# Patient Record
Sex: Male | Born: 1990
Health system: Southern US, Community
[De-identification: ages and names within clinical notes are randomized; demographics above are authoritative.]

## PROBLEM LIST (undated history)

## (undated) DIAGNOSIS — E291 Testicular hypofunction: Secondary | ICD-10-CM

## (undated) DIAGNOSIS — E669 Obesity, unspecified: Secondary | ICD-10-CM

## (undated) DIAGNOSIS — I10 Essential (primary) hypertension: Secondary | ICD-10-CM

## (undated) HISTORY — DX: Testicular hypofunction: E29.1

## (undated) HISTORY — DX: Essential (primary) hypertension: I10

## (undated) HISTORY — DX: Obesity, unspecified: E66.9

---

## 2016-05-05 ENCOUNTER — Encounter: Payer: Self-pay | Admitting: Physician Assistant

## 2016-05-05 ENCOUNTER — Ambulatory Visit (INDEPENDENT_AMBULATORY_CARE_PROVIDER_SITE_OTHER): Payer: BLUE CROSS/BLUE SHIELD | Admitting: Physician Assistant

## 2016-05-05 VITALS — BP 153/80 | HR 93 | Ht 71.0 in | Wt 348.0 lb

## 2016-05-05 DIAGNOSIS — R5383 Other fatigue: Secondary | ICD-10-CM | POA: Insufficient documentation

## 2016-05-05 DIAGNOSIS — M5126 Other intervertebral disc displacement, lumbar region: Secondary | ICD-10-CM

## 2016-05-05 DIAGNOSIS — Z131 Encounter for screening for diabetes mellitus: Secondary | ICD-10-CM

## 2016-05-05 DIAGNOSIS — Z0000 Encounter for general adult medical examination without abnormal findings: Secondary | ICD-10-CM

## 2016-05-05 DIAGNOSIS — Z13220 Encounter for screening for lipoid disorders: Secondary | ICD-10-CM

## 2016-05-05 MED ORDER — LISINOPRIL 20 MG PO TABS: 20.0000 mg | ORAL_TABLET | Freq: Every day | ORAL | Status: DC

## 2016-05-05 MED ORDER — METHYLPREDNISOLONE 4 MG PO TBPK: ORAL_TABLET | ORAL | Status: DC

## 2016-05-05 NOTE — Progress Notes (Signed)
   Subjective:    Patient ID: Dale Howard, male    DOB: [birth year], 25 y.o.   MRN: [id number]  HPI Pt is a 25 yo male who presents to the clinic to establish care.   .. Active Ambulatory Problems    Diagnosis Date Noted  . Lumbar disc herniation 05/05/2016  . Morbid obesity due to excess calories (HCC) 05/05/2016  . Other fatigue 05/05/2016   Resolved Ambulatory Problems    Diagnosis Date Noted  . No Resolved Ambulatory Problems   No Additional Past Medical History   .Marland Kitchen. Family History  Problem Relation Age of Onset  . Heart attack Maternal Grandmother   . Hypertension Maternal Grandmother   . Hyperlipidemia Maternal Grandmother    . Social History   Social History  . Marital Status: Single    Spouse Name: N/A  . Number of Children: N/A  . Years of Education: N/A   Occupational History  . Not on file.   Social History Main Topics  . Smoking status: Former Games developermoker  . Smokeless tobacco: Not on file  . Alcohol Use: 0.0 oz/week    0 Standard drinks or equivalent per week  . Drug Use: No  . Sexual Activity: Yes   Other Topics Concern  . Not on file   Social History Narrative  . No narrative on file   Pt would like to lose weight. He has stopped drinking sodas. He would like medication help.   He is tired and would like some labs. Not had fasting labs done in over 3 years.   He has had an ongoing cough and congestion for a month. Clear sputum production. No fever, chills, ST, ear pain, SOB, or wheezing. Taking zyrtec.    Review of Systems  All other systems reviewed and are negative.      Objective:   Physical Exam  Constitutional: He is oriented to person, place, and time. He appears well-developed and well-nourished.  Obesity.   HENT:  Head: Normocephalic and atraumatic.  Right Ear: External ear normal.  Left Ear: External ear normal.  Nose: Nose normal.  Mouth/Throat: Oropharynx is clear and moist. No oropharyngeal exudate.  PND on oropharynx.    Eyes: Conjunctivae are normal. Right eye exhibits no discharge. Left eye exhibits no discharge.  Neck: Normal range of motion. Neck supple.  Cardiovascular: Normal rate, regular rhythm and normal heart sounds.   Pulmonary/Chest: Effort normal and breath sounds normal. He has no wheezes.  Lymphadenopathy:    He has no cervical adenopathy.  Neurological: He is alert and oriented to person, place, and time.  Skin: Skin is dry.  Psychiatric: He has a normal mood and affect. His behavior is normal.          Assessment & Plan:  HTN- start lisinopril 20mg  daily. Discussed SE's. Follow up in 1 month for BP recheck.   Morbid obesity- discussed options. Would like to start phentermine but have to control BP first. Discussed calorie restrictions and start exercising regularly.   PND/allergic rhinitis- reassured patient no signs of infection. Continue zyrtec. Medrol dose pak given.   Fatigue- panel ordered. Denies depression PHQ-2 was 0.   Routine physical- lipid and cmp ordered.

## 2016-05-17 LAB — CBC
HCT: 50.7 % — ABNORMAL HIGH (ref 38.5–50.0)
HEMOGLOBIN: 17.4 g/dL — AB (ref 13.2–17.1)
MCH: 29.5 pg (ref 27.0–33.0)
MCHC: 34.3 g/dL (ref 32.0–36.0)
MCV: 86.1 fL (ref 80.0–100.0)
MPV: 11.8 fL (ref 7.5–12.5)
Platelets: 208 10*3/uL (ref 140–400)
RBC: 5.89 MIL/uL — AB (ref 4.20–5.80)
RDW: 14.2 % (ref 11.0–15.0)
WBC: 10 10*3/uL (ref 3.8–10.8)

## 2016-05-18 DIAGNOSIS — S62612A Displaced fracture of proximal phalanx of right middle finger, initial encounter for closed fracture: Secondary | ICD-10-CM | POA: Insufficient documentation

## 2016-05-18 LAB — COMPLETE METABOLIC PANEL WITH GFR
ALT: 57 U/L — AB (ref 9–46)
AST: 29 U/L (ref 10–40)
Albumin: 4.4 g/dL (ref 3.6–5.1)
Alkaline Phosphatase: 106 U/L (ref 40–115)
BUN: 16 mg/dL (ref 7–25)
CALCIUM: 9.2 mg/dL (ref 8.6–10.3)
CHLORIDE: 103 mmol/L (ref 98–110)
CO2: 21 mmol/L (ref 20–31)
CREATININE: 0.84 mg/dL (ref 0.60–1.35)
GFR, Est African American: >89 mL/min (ref >=60)
GFR, Est Non African American: >89 mL/min (ref >=60)
Glucose, Bld: 86 mg/dL (ref 65–99)
Potassium: 4.3 mmol/L (ref 3.5–5.3)
Sodium: 140 mmol/L (ref 135–146)
Total Bilirubin: 0.4 mg/dL (ref 0.2–1.2)
Total Protein: 7.3 g/dL (ref 6.1–8.1)

## 2016-05-18 LAB — LIPID PANEL
CHOLESTEROL: 166 mg/dL (ref 125–200)
HDL: 45 mg/dL (ref >=40)
LDL Cholesterol: 99 mg/dL (ref <130)
Total CHOL/HDL Ratio: 3.7 Ratio (ref <=5.0)
Triglycerides: 111 mg/dL (ref <150)
VLDL: 22 mg/dL (ref <30)

## 2016-05-18 LAB — TESTOSTERONE TOTAL,FREE,BIO, MALES
Albumin: 4.4 g/dL (ref 3.6–5.1)
Sex Hormone Binding: 12 nmol/L (ref 10–50)
TESTOSTERONE BIOAVAILABLE: 101.8 ng/dL — AB (ref 130.5–681.7)
Testosterone, Free: 50.6 pg/mL (ref 47.0–244.0)
Testosterone: 205 ng/dL — ABNORMAL LOW (ref 250–827)

## 2016-05-18 LAB — VITAMIN D 25 HYDROXY (VIT D DEFICIENCY, FRACTURES): Vit D, 25-Hydroxy: 15 ng/mL — ABNORMAL LOW (ref 30–100)

## 2016-05-18 LAB — TSH: TSH: 1.11 m[IU]/L (ref 0.40–4.50)

## 2016-05-18 LAB — C-REACTIVE PROTEIN

## 2016-05-18 LAB — SEDIMENTATION RATE: Sed Rate: 1 mm/hr (ref 0–15)

## 2016-05-18 LAB — VITAMIN B12: VITAMIN B 12: 475 pg/mL (ref 200–1100)

## 2016-05-18 LAB — FERRITIN: Ferritin: 137 ng/mL (ref 20–345)

## 2016-05-19 ENCOUNTER — Encounter: Payer: Self-pay | Admitting: Physician Assistant

## 2016-05-19 DIAGNOSIS — E559 Vitamin D deficiency, unspecified: Secondary | ICD-10-CM | POA: Insufficient documentation

## 2016-05-19 DIAGNOSIS — R740 Nonspecific elevation of levels of transaminase and lactic acid dehydrogenase [LDH]: Secondary | ICD-10-CM

## 2016-05-19 DIAGNOSIS — R7989 Other specified abnormal findings of blood chemistry: Secondary | ICD-10-CM | POA: Insufficient documentation

## 2016-05-19 DIAGNOSIS — R7401 Elevation of levels of liver transaminase levels: Secondary | ICD-10-CM | POA: Insufficient documentation

## 2016-05-23 ENCOUNTER — Other Ambulatory Visit: Payer: Self-pay

## 2016-05-23 MED ORDER — VITAMIN D (ERGOCALCIFEROL) 1.25 MG (50000 UNIT) PO CAPS: 50000.0000 [IU] | ORAL_CAPSULE | ORAL | Status: DC

## 2016-06-02 ENCOUNTER — Encounter: Payer: Self-pay | Admitting: Physician Assistant

## 2016-06-02 ENCOUNTER — Ambulatory Visit (INDEPENDENT_AMBULATORY_CARE_PROVIDER_SITE_OTHER): Payer: BLUE CROSS/BLUE SHIELD | Admitting: Physician Assistant

## 2016-06-02 DIAGNOSIS — Z79899 Other long term (current) drug therapy: Secondary | ICD-10-CM

## 2016-06-02 DIAGNOSIS — R748 Abnormal levels of other serum enzymes: Secondary | ICD-10-CM

## 2016-06-02 DIAGNOSIS — E291 Testicular hypofunction: Secondary | ICD-10-CM

## 2016-06-02 DIAGNOSIS — I10 Essential (primary) hypertension: Secondary | ICD-10-CM

## 2016-06-02 DIAGNOSIS — R7401 Elevation of levels of liver transaminase levels: Secondary | ICD-10-CM

## 2016-06-02 DIAGNOSIS — D582 Other hemoglobinopathies: Secondary | ICD-10-CM

## 2016-06-02 DIAGNOSIS — R740 Nonspecific elevation of levels of transaminase and lactic acid dehydrogenase [LDH]: Secondary | ICD-10-CM

## 2016-06-02 DIAGNOSIS — R7989 Other specified abnormal findings of blood chemistry: Secondary | ICD-10-CM

## 2016-06-02 MED ORDER — PHENTERMINE HCL 37.5 MG PO TABS: 37.5000 mg | ORAL_TABLET | Freq: Every day | ORAL | Status: DC

## 2016-06-02 MED ORDER — LISINOPRIL 20 MG PO TABS: 20.0000 mg | ORAL_TABLET | Freq: Every day | ORAL | Status: DC

## 2016-06-02 NOTE — Patient Instructions (Signed)

## 2016-06-05 DIAGNOSIS — I10 Essential (primary) hypertension: Secondary | ICD-10-CM | POA: Insufficient documentation

## 2016-06-05 DIAGNOSIS — D582 Other hemoglobinopathies: Secondary | ICD-10-CM | POA: Insufficient documentation

## 2016-06-05 NOTE — Progress Notes (Signed)
   Subjective:    Patient ID: Dale Howard, male    DOB: 05-Jul-1991, 25 y.o.   MRN: [id number]  HPI Patient is a 25 year old male who presents to the clinic to follow-up on blood pressure. He establish as a new patient and his blood pressure was elevated. He was hoping to start weight loss medication and I told him we had to get his blood pressure under control first. He is now taking lisinopril 20 mg daily. He denies any chest pains, palpitations, headaches or dizziness. He is taking his lisinopril daily.  Patient is very interested in phentermine. He has heard of his friends that have been on and lost a lot of weight. He is very motivated. He is early started working out in his wife who is with him today is very encouraged that they can work out together.  We did go over labs. His testosterone was low. He would like to consider replacement.   Review of Systems  All other systems reviewed and are negative.      Objective:   Physical Exam  Constitutional: He is oriented to person, place, and time. He appears well-developed and well-nourished.  Morbidly obese.   HENT:  Head: Normocephalic and atraumatic.  Cardiovascular: Normal rate, regular rhythm and normal heart sounds.   Pulmonary/Chest: Effort normal and breath sounds normal.  Neurological: He is alert and oriented to person, place, and time.  Psychiatric: He has a normal mood and affect. His behavior is normal.          Assessment & Plan:  Morbid obesity- started phentermine today. Discussed side effects. Encouraged exercise and diet changes. Follow up in 1 month.   Elevated liver enzymes- will recheck today.   Low testosterone- will check testosterone, cbc, and psa. In 2 weeks.   Elevated hgb, hematocrit- pt describes a time where he had to get "blood drawn because he made too many RBC". Sounds like polycythemia vera. Will try to get records. May have to hold on testosterone replacment and make sure safe since  testosterone can increase hemocrit.   Vitamin D def- stay on high dose. Will recheck.   HTN- controlled today. Likely will decrease with weight loss. Continue on lisinopril for now.

## 2016-06-28 ENCOUNTER — Ambulatory Visit (INDEPENDENT_AMBULATORY_CARE_PROVIDER_SITE_OTHER): Payer: BLUE CROSS/BLUE SHIELD | Admitting: Physician Assistant

## 2016-06-28 ENCOUNTER — Encounter: Payer: Self-pay | Admitting: Physician Assistant

## 2016-06-28 DIAGNOSIS — R740 Nonspecific elevation of levels of transaminase and lactic acid dehydrogenase [LDH]: Secondary | ICD-10-CM | POA: Diagnosis not present

## 2016-06-28 DIAGNOSIS — E291 Testicular hypofunction: Secondary | ICD-10-CM | POA: Diagnosis not present

## 2016-06-28 DIAGNOSIS — R7401 Elevation of levels of liver transaminase levels: Secondary | ICD-10-CM

## 2016-06-28 DIAGNOSIS — R635 Abnormal weight gain: Secondary | ICD-10-CM

## 2016-06-28 DIAGNOSIS — Z6841 Body Mass Index (BMI) 40.0 and over, adult: Secondary | ICD-10-CM | POA: Insufficient documentation

## 2016-06-28 LAB — PSA: PSA: 0.41 ng/mL (ref <=4.00)

## 2016-06-28 LAB — HEPATIC FUNCTION PANEL
ALBUMIN: 4.4 g/dL (ref 3.6–5.1)
ALK PHOS: 101 U/L (ref 40–115)
ALT: 43 U/L (ref 9–46)
AST: 24 U/L (ref 10–40)
BILIRUBIN TOTAL: 0.7 mg/dL (ref 0.2–1.2)
Bilirubin, Direct: 0.1 mg/dL (ref <=0.2)
Indirect Bilirubin: 0.6 mg/dL (ref 0.2–1.2)
TOTAL PROTEIN: 7.2 g/dL (ref 6.1–8.1)

## 2016-06-28 LAB — CBC WITH DIFFERENTIAL/PLATELET
BASOS ABS: 0 {cells}/uL (ref 0–200)
BASOS PCT: 0 %
EOS ABS: 231 {cells}/uL (ref 15–500)
EOS PCT: 3 %
HCT: 48.3 % (ref 38.5–50.0)
HEMOGLOBIN: 16.3 g/dL (ref 13.2–17.1)
LYMPHS ABS: 1925 {cells}/uL (ref 850–3900)
Lymphocytes Relative: 25 %
MCH: 29.2 pg (ref 27.0–33.0)
MCHC: 33.7 g/dL (ref 32.0–36.0)
MCV: 86.6 fL (ref 80.0–100.0)
MONOS PCT: 12 %
MPV: 12 fL (ref 7.5–12.5)
Monocytes Absolute: 924 cells/uL (ref 200–950)
NEUTROS ABS: 4620 {cells}/uL (ref 1500–7800)
Neutrophils Relative %: 60 %
PLATELETS: 204 10*3/uL (ref 140–400)
RBC: 5.58 MIL/uL (ref 4.20–5.80)
RDW: 14.2 % (ref 11.0–15.0)
WBC: 7.7 10*3/uL (ref 3.8–10.8)

## 2016-06-28 LAB — TESTOSTERONE TOTAL,FREE,BIO, MALES
Albumin: 4.4 g/dL (ref 3.6–5.1)
SEX HORMONE BINDING: 13 nmol/L (ref 10–50)
TESTOSTERONE BIOAVAILABLE: 99.9 ng/dL — AB (ref 130.5–681.7)
TESTOSTERONE FREE: 49.6 pg/mL (ref 47.0–244.0)
TESTOSTERONE: 210 ng/dL — AB (ref 250–827)

## 2016-06-28 MED ORDER — PHENTERMINE HCL 37.5 MG PO TABS: 37.5000 mg | ORAL_TABLET | Freq: Every day | ORAL | Status: DC

## 2016-06-28 NOTE — Progress Notes (Signed)
   Subjective:    Patient ID: Dale Howard, male    DOB: 08/17/1991, 25 y.o.   MRN: [id number]  HPI  Patient is a 25 year old male who presents to the clinic to follow-up on phentermine and weight loss. He has lost 14 pounds in one month. He is taking 1 tablet of phentermine daily. He has a lot more energy and feels more focused. He is exercising nearly every day. He notices he does not have cravings and sticking to a healthy diet. He denies any insomnia, anxiety, palpitations, chest pain. He does have some dry mouth.  He did have some recent labs done and would like to discuss these today.    Review of Systems  All other systems reviewed and are negative.      Objective:   Physical Exam  Constitutional: He is oriented to person, place, and time. He appears well-developed and well-nourished.  Morbid obesity.   Cardiovascular: Normal rate, regular rhythm and normal heart sounds.   Pulmonary/Chest: Effort normal and breath sounds normal.  Neurological: He is alert and oriented to person, place, and time.  Psychiatric: He has a normal mood and affect. His behavior is normal.          Assessment & Plan:  Morbid obesity/abnormal weight loss- refilled phentermine. Next visit nurse visit only. Continue with diet and exercise.   Male hypogonadism- testosterone is low and I do think patient would benefit from testosterone replacement. PSA normal however hgb is elevated and a suspicous hx of polycythemia vera. I am concerned this is a contraindication for testosterone replacement. I will consult endocrinology and see what they say or if they would like to follow.   Elevated liver enzymes-resolved.

## 2016-07-14 ENCOUNTER — Telehealth: Payer: Self-pay | Admitting: Physician Assistant

## 2016-07-18 NOTE — Telephone Encounter (Signed)
Joy with Dr Tonna Boehringer office called and states Dr Morrison Old said it would not be a good idea to start testosterone.

## 2016-07-18 NOTE — Telephone Encounter (Signed)
Left a message with Dr Tonna Boehringer office to see if polycythemia vera is a contraindication for testosterone.

## 2016-07-19 NOTE — Telephone Encounter (Signed)
Call pt: endocrinology does not think we should do any testosterone due to hematocrit and dx of polycythemia vera. If you wanted a consult we could arrange to discuss in person.

## 2016-07-24 ENCOUNTER — Telehealth: Payer: Self-pay

## 2016-07-24 ENCOUNTER — Ambulatory Visit (INDEPENDENT_AMBULATORY_CARE_PROVIDER_SITE_OTHER): Payer: BLUE CROSS/BLUE SHIELD | Admitting: Family Medicine

## 2016-07-24 VITALS — BP 126/82 | HR 98 | Wt 321.8 lb

## 2016-07-24 DIAGNOSIS — Z6841 Body Mass Index (BMI) 40.0 and over, adult: Secondary | ICD-10-CM | POA: Diagnosis not present

## 2016-07-24 DIAGNOSIS — R635 Abnormal weight gain: Secondary | ICD-10-CM | POA: Diagnosis not present

## 2016-07-24 MED ORDER — PHENTERMINE HCL 37.5 MG PO TABS: 37.5000 mg | ORAL_TABLET | Freq: Every day | ORAL | 0 refills | Status: DC

## 2016-07-24 NOTE — Progress Notes (Signed)
   Subjective:    Patient ID: Dale Howard, male    DOB: June 14, 1991, 25 y.o.   MRN: [id number]  HPI  Pt here for BP and WT check.  Denies trouble sleeping, palpitations, and medication problems.      Review of Systems     Objective:   Physical Exam        Assessment & Plan:  Morbid obesity/BMI greater than 40/abnormal weight gain-Pt has lost weight.  A refill for Phentermine will be faxed to the pharmacy.  Pt advised to schedule a nurse  appointment in 30 days for BP and wt check.  Down 7 pounds.  BMI now 44.8. Continue current regimen. Follow-up in one month. Nani Gasseratherine Metheney, MD

## 2016-07-25 NOTE — Telephone Encounter (Signed)
No I consulted endocrinologist and did not we me to start testosterone. If you do want to start I would like for you to discuss with endocrinology. Would you want a consult?

## 2016-07-25 NOTE — Telephone Encounter (Signed)
Pt will hold off on meeting with endocrinology for now.

## 2016-08-22 ENCOUNTER — Ambulatory Visit: Payer: BLUE CROSS/BLUE SHIELD

## 2016-08-25 ENCOUNTER — Ambulatory Visit: Payer: BLUE CROSS/BLUE SHIELD

## 2016-10-13 ENCOUNTER — Encounter: Payer: Self-pay | Admitting: Physician Assistant

## 2016-10-13 ENCOUNTER — Ambulatory Visit (INDEPENDENT_AMBULATORY_CARE_PROVIDER_SITE_OTHER): Payer: BLUE CROSS/BLUE SHIELD | Admitting: Physician Assistant

## 2016-10-13 DIAGNOSIS — N522 Drug-induced erectile dysfunction: Secondary | ICD-10-CM | POA: Diagnosis not present

## 2016-10-13 MED ORDER — LIRAGLUTIDE -WEIGHT MANAGEMENT 18 MG/3ML ~~LOC~~ SOPN: 0.6000 mg | PEN_INJECTOR | Freq: Every day | SUBCUTANEOUS | 1 refills | Status: DC

## 2016-10-13 MED ORDER — SILDENAFIL CITRATE 100 MG PO TABS: 100.0000 mg | ORAL_TABLET | ORAL | 2 refills | Status: DC | PRN

## 2016-10-13 MED ORDER — AMBULATORY NON FORMULARY MEDICATION: 0 refills | Status: DC

## 2016-10-13 NOTE — Patient Instructions (Signed)
Liraglutide injection (Weight Management) What is this medicine? LIRAGLUTIDE (LIR a GLOO tide) is used with a reduced calorie diet and exercise to help you lose weight. This medicine may be used for other purposes; ask your health care provider or pharmacist if you have questions. What should I tell my health care provider before I take this medicine? They need to know if you have any of these conditions: -endocrine tumors (MEN 2) or if someone in your family had these tumors -gallstones -high cholesterol -history of alcohol abuse problem -history of pancreatitis -kidney disease or if you are on dialysis -liver disease -previous swelling of the tongue, face, or lips with difficulty breathing, difficulty swallowing, hoarseness, or tightening of the throat -stomach problems -suicidal thoughts, plans, or attempt; a previous suicide attempt by you or a family member -thyroid cancer or if someone in your family had thyroid cancer -an unusual or allergic reaction to liraglutide, medicines, foods, dyes, or preservatives -pregnant or trying to get pregnant -breast-feeding How should I use this medicine? This medicine is for injection under the skin of your upper leg, stomach area, or upper arm. You will be taught how to prepare and give this medicine. Use exactly as directed. Take your medicine at regular intervals. Do not take it more often than directed. It is important that you put your used needles and syringes in a special sharps container. Do not put them in a trash can. If you do not have a sharps container, call your pharmacist or healthcare provider to get one. A special MedGuide will be given to you by the pharmacist with each prescription and refill. Be sure to read this information carefully each time. Talk to your pediatrician regarding the use of this medicine in children. Special care may be needed. Overdosage: If you think you have taken too much of this medicine contact a poison  control center or emergency room at once. NOTE: This medicine is only for you. Do not share this medicine with others. What if I miss a dose? If you miss a dose, take it as soon as you can. If it is almost time for your next dose, take only that dose. Do not take double or extra doses. If you miss your dose for 3 days or more, call your doctor or health care professional to talk about how to restart this medicine. What may interact with this medicine? -acetaminophen -atorvastatin -birth control pills -digoxin -griseofulvin -lisinopril This list may not describe all possible interactions. Give your health care provider a list of all the medicines, herbs, non-prescription drugs, or dietary supplements you use. Also tell them if you smoke, drink alcohol, or use illegal drugs. Some items may interact with your medicine. What should I watch for while using this medicine? Visit your doctor or health care professional for regular checks on your progress. This medicine is intended to be used in addition to a healthy diet and appropriate exercise. The best results are achieved this way. Do not increase or in any way change your dose without consulting your doctor or health care professional. This medicine may affect blood sugar levels. If you have diabetes, check with your doctor or health care professional before you change your diet or the dose of your diabetic medicine. Patients and their families should watch out for worsening depression or thoughts of suicide. Also watch out for sudden changes in feelings such as feeling anxious, agitated, panicky, irritable, hostile, aggressive, impulsive, severely restless, overly excited and hyperactive, or not being   able to sleep. If this happens, especially at the beginning of treatment or after a change in dose, call your health care professional. What side effects may I notice from receiving this medicine? Side effects that you should report to your doctor or  health care professional as soon as possible: -allergic reactions like skin rash, itching or hives, swelling of the face, lips, or tongue -breathing problems -fever, chills -loss of appetite -signs and symptoms of low blood sugar such as feeling anxious, confusion, dizziness, increased hunger, unusually weak or tired, sweating, shakiness, cold, irritable, headache, blurred vision, fast heartbeat, loss of consciousness -trouble passing urine or change in the amount of urine -unusual stomach pain or upset -vomiting Side effects that usually do not require medical attention (Report these to your doctor or health care professional if they continue or are bothersome.): -constipation -diarrhea -fatigue -headache -nausea This list may not describe all possible side effects. Call your doctor for medical advice about side effects. You may report side effects to FDA at 1-800-FDA-1088. Where should I keep my medicine? Keep out of the reach of children. Store unopened pen in a refrigerator between 2 and 8 degrees C (36 and 46 degrees F). Do not freeze or use if the medicine has been frozen. Protect from light and excessive heat. After you first use the pen, it can be stored at room temperature between 15 and 30 degrees C (59 and 86 degrees F) or in a refrigerator. Throw away your used pen after 30 days or after the expiration date, whichever comes first. Do not store your pen with the needle attached. If the needle is left on, medicine may leak from the pen. NOTE: This sheet is a summary. It may not cover all possible information. If you have questions about this medicine, talk to your doctor, pharmacist, or health care provider.    2016, Elsevier/Gold Standard. (2014-01-29 12:29:49)  

## 2016-10-13 NOTE — Progress Notes (Addendum)
   Subjective:    Patient ID: Dale Howard, male    DOB: [birth year], 25 y.o.   MRN: [id number]  HPI  Pt is a 25 yo male who presents to the clinic to discuss weight loss.  His original weight was 348. He is now 321. Phentermine is really not helping him at this point and causing ED. He would like something else to help with ED. He is working out and lifting weights 3 times a week. He wants to continue to lose weight.    Review of Systems  All other systems reviewed and are negative.      Objective:   Physical Exam  Constitutional: He is oriented to person, place, and time. He appears well-developed and well-nourished.  Morbidly obese.   HENT:  Head: Normocephalic and atraumatic.  Cardiovascular: Normal rate, regular rhythm and normal heart sounds.   Pulmonary/Chest: Effort normal and breath sounds normal.  Neurological: He is alert and oriented to person, place, and time.  Psychiatric: He has a normal mood and affect. His behavior is normal.          Assessment & Plan:  Marland Kitchen.Marland Kitchen.Diagnoses and all orders for this visit:  Morbid obesity (HCC) -     Liraglutide -Weight Management (SAXENDA) 18 MG/3ML SOPN; Inject 0.6 mg into the skin daily. For one week then increase by .6mg  weekly until reaches 3mg  daily.  Please include ultra fine needles 6mm -     AMBULATORY NON FORMULARY MEDICATION; 6mm ultra find needles for saxenda pen to use once daily.  Drug-induced erectile dysfunction -     sildenafil (VIAGRA) 100 MG tablet; Take 1 tablet (100 mg total) by mouth as needed for erectile dysfunction (for use prior to sexual activity).   Discussed with patient how to use and side effects of saxenda.  Follow up in 2 months.  Discussed 1500-2000 calorie diet and regular 150 minutes a week exercise.  Phentermine likely caused ED.  viagra given to try.

## 2016-11-03 ENCOUNTER — Telehealth: Payer: Self-pay | Admitting: *Deleted

## 2016-11-03 NOTE — Telephone Encounter (Signed)
Saxenda submitted through covermymeds : [id number] - Rx #: [id number]

## 2016-11-08 NOTE — Telephone Encounter (Signed)
Tried to call patient and connection was bad; hung up and will try later

## 2016-11-08 NOTE — Telephone Encounter (Signed)
saxenda has been denied by insurance. Patient needs to have tried both Belviq and Qsymia (noted Qsymia contraindicated because  phentermine component may  caused ED since patient did take phentermine and this was side effect)

## 2016-11-08 NOTE — Telephone Encounter (Signed)
Ok call pt and see if would like to try belviq 10mg  bid for next 2 months. Biggest side effect is nausea.

## 2016-11-15 MED ORDER — LORCASERIN HCL 10 MG PO TABS: 10.0000 mg | ORAL_TABLET | Freq: Two times a day (BID) | ORAL | 1 refills | Status: DC

## 2016-11-15 NOTE — Telephone Encounter (Signed)
belviq savings card up at the front and patient is aware

## 2016-11-15 NOTE — Telephone Encounter (Signed)
Patient is ok with trying Belviq

## 2016-11-16 ENCOUNTER — Telehealth: Payer: Self-pay | Admitting: *Deleted

## 2016-11-16 NOTE — Telephone Encounter (Signed)
PA submitted for Belviq through covermymeds [id number]

## 2016-11-17 NOTE — Telephone Encounter (Signed)
Patient notified

## 2016-11-21 ENCOUNTER — Ambulatory Visit (INDEPENDENT_AMBULATORY_CARE_PROVIDER_SITE_OTHER): Payer: BLUE CROSS/BLUE SHIELD

## 2016-11-21 ENCOUNTER — Ambulatory Visit (INDEPENDENT_AMBULATORY_CARE_PROVIDER_SITE_OTHER): Payer: BLUE CROSS/BLUE SHIELD | Admitting: Physician Assistant

## 2016-11-21 ENCOUNTER — Encounter: Payer: Self-pay | Admitting: Physician Assistant

## 2016-11-21 VITALS — BP 160/98 | HR 98 | Ht 71.0 in | Wt 312.0 lb

## 2016-11-21 DIAGNOSIS — M545 Low back pain, unspecified: Secondary | ICD-10-CM

## 2016-11-21 DIAGNOSIS — M79605 Pain in left leg: Secondary | ICD-10-CM

## 2016-11-21 DIAGNOSIS — Z8739 Personal history of other diseases of the musculoskeletal system and connective tissue: Secondary | ICD-10-CM

## 2016-11-21 MED ORDER — PREDNISONE 10 MG (21) PO TBPK: ORAL_TABLET | ORAL | 0 refills | Status: DC

## 2016-11-21 MED ORDER — CYCLOBENZAPRINE HCL 10 MG PO TABS: 10.0000 mg | ORAL_TABLET | Freq: Three times a day (TID) | ORAL | 0 refills | Status: DC | PRN

## 2016-11-21 MED ORDER — MELOXICAM 15 MG PO TABS: 15.0000 mg | ORAL_TABLET | Freq: Every day | ORAL | 1 refills | Status: DC

## 2016-11-21 MED ORDER — TRAMADOL HCL 50 MG PO TABS: 50.0000 mg | ORAL_TABLET | Freq: Four times a day (QID) | ORAL | 0 refills | Status: DC | PRN

## 2016-11-21 NOTE — Progress Notes (Addendum)
   Subjective:    Patient ID: Dale Howard, male    DOB: 10-13-1991, 25 y.o.   MRN: [id number]  HPI Patient is a 25 yo complaining of lower back pain that radiates to his left leg for six days. Patient was diagnosed with a herniated lumbar intervertebral disc in 2013. He saw a chiropractor after being diagnosed and his symptoms resolved within a few months. Patient rates the pain a 8/10. He reports that the pain is better when he stands and worse when he lays down. He describes the pain as an electric shock that runs from his lower back down to his left shin. He also reports tingling in his left leg. He also reports that his lower back muscles feel tight. He has not slept the past few nights despite taking ibuprofen 800 MG and tylenol PM 1000 MG. Patient denies neck pain or knee pain. Patient does not recall doing anything to injure himself.    Review of Systems See HPI    Objective:   Physical Exam  Constitutional: He is oriented to person, place, and time. He appears well-developed and well-nourished.  Patient appears uncomfortable during exam and has to stand during most of the exam to get relief.  Cardiovascular: Normal rate and regular rhythm.   Pulmonary/Chest: Effort normal.  Musculoskeletal: He exhibits no deformity.  No tenderness to palpation of cervical, thoracic or lumbar vertebrae. No bony abnormalities noted. Decreased ROM with back flexion and extension. Positive straight leg raise on left side.   Neurological: He is alert and oriented to person, place, and time.       Assessment & Plan:  Dale Howard was seen today for back pain.  Diagnoses and all orders for this visit:  1. Low back pain radiating to left leg most likely due to herniated lumbar vertebrae due to patient's symptoms and history of herniated intervertebral disc.  -Start predniSONE; 12 day taper pack to help shrink disc.  -Start cyclobenzaprine (FLEXERIL) 10 MG tablet; Take 1 tablet (10 mg total) by mouth 3 (three)  times daily as needed for muscle spasms. Patient advised to take this medication at night to help him sleep. Patient advised not to drive or operate heavy machinery while taking this medication. - Start meloxicam (MOBIC) 15 MG tablet; Take 1 tablet (15 mg total) by mouth daily to help with pain. Decided to go with Mobic due to acute inflammation and ibuprofen failed to help with pain.  - DG Lumbar Spine Complete to look for displaced vertebrae. Will possibly get an MRI at a later date if patient's pain does not improve.  -Start traMADol (ULTRAM) 50 MG tablet; Take 1 tablet (50 mg total) by mouth every 6 (six) hours as needed for acute pain.  -Referral to physical therapy if back pain does not improve in a week. Patient was given lower back stretches to do on his own for a week.  -Follow-up with sports medicine in 2-3 weeks if back pain does not improve.

## 2016-11-21 NOTE — Addendum Note (Signed)
Addended by: Jomarie LongsBREEBACK, JADE L on: 11/21/2016 10:35 PM   Modules accepted: Orders

## 2016-11-21 NOTE — Patient Instructions (Signed)
Will schedule PT

## 2016-11-21 NOTE — Telephone Encounter (Signed)
Patient was notified that belviq was approved by insurance.approved from 11/16/2016-05/14/2017

## 2016-11-21 NOTE — Progress Notes (Signed)
Call pt: alignment is normal. Disc space look good. No acute changes.

## 2016-12-15 ENCOUNTER — Ambulatory Visit: Payer: BLUE CROSS/BLUE SHIELD | Admitting: Physician Assistant

## 2016-12-20 ENCOUNTER — Ambulatory Visit (INDEPENDENT_AMBULATORY_CARE_PROVIDER_SITE_OTHER): Payer: BLUE CROSS/BLUE SHIELD | Admitting: Physician Assistant

## 2016-12-20 ENCOUNTER — Encounter: Payer: Self-pay | Admitting: Physician Assistant

## 2016-12-20 VITALS — BP 146/83 | HR 111 | Ht 71.0 in | Wt 320.0 lb

## 2016-12-20 DIAGNOSIS — I10 Essential (primary) hypertension: Secondary | ICD-10-CM | POA: Diagnosis not present

## 2016-12-20 MED ORDER — LISINOPRIL 40 MG PO TABS: 40.0000 mg | ORAL_TABLET | Freq: Every day | ORAL | 1 refills | Status: DC

## 2016-12-20 MED ORDER — BUPROPION HCL ER (XL) 150 MG PO TB24: 150.0000 mg | ORAL_TABLET | Freq: Every day | ORAL | 2 refills | Status: DC

## 2016-12-20 NOTE — Progress Notes (Signed)
   Subjective:    Patient ID: Dale Howard, male    DOB: 1991/07/13, 26 y.o.   MRN: [id number]  HPI  Pt presents to the clinic to follow up on weight. Phentermine SE of ED, saxenda insurance would not approve. Belviq was approved but could not get at pharmacy until his ID is updated via the government. He has gained 8lb in last 2 months. He is very interested in the gastric sleeve. He admits to not exercising.   HTN- no CP, palpitations, headaches, or dizziness. He is taking lisinopril daily without any side effects.      Review of Systems  All other systems reviewed and are negative.      Objective:   Physical Exam  Constitutional: He is oriented to person, place, and time. He appears well-developed and well-nourished.  Morbid obesity.   HENT:  Head: Normocephalic and atraumatic.  Cardiovascular: Normal rate, regular rhythm and normal heart sounds.   Pulmonary/Chest: Effort normal and breath sounds normal.  Neurological: He is alert and oriented to person, place, and time.  Psychiatric: He has a normal mood and affect. His behavior is normal.          Assessment & Plan:  Marland Kitchen.Marland Kitchen.Diagnoses and all orders for this visit:  Essential hypertension, benign -     lisinopril (PRINIVIL,ZESTRIL) 40 MG tablet; Take 1 tablet (40 mg total) by mouth daily.  Morbid obesity due to excess calories (HCC) -     buPROPion (WELLBUTRIN XL) 150 MG 24 hr tablet; Take 1 tablet (150 mg total) by mouth daily. -     Ambulatory referral to General Surgery   Will increase lisinopril due to BP not being controlled. Recheck in 2 months.   Since he cannot get belviq which is considered a controlled substance will try wellbutrin. Discussed side effects. Follow up in 2 months. Get belviq when can. Will refer to gastric sleeve.

## 2017-02-06 ENCOUNTER — Encounter: Payer: Self-pay | Admitting: Physician Assistant

## 2017-02-06 ENCOUNTER — Ambulatory Visit (INDEPENDENT_AMBULATORY_CARE_PROVIDER_SITE_OTHER): Payer: BLUE CROSS/BLUE SHIELD | Admitting: Physician Assistant

## 2017-02-06 VITALS — BP 175/120 | HR 109 | Temp 98.0°F | Ht 71.0 in | Wt 315.0 lb

## 2017-02-06 DIAGNOSIS — M545 Low back pain, unspecified: Secondary | ICD-10-CM

## 2017-02-06 DIAGNOSIS — M5126 Other intervertebral disc displacement, lumbar region: Secondary | ICD-10-CM

## 2017-02-06 DIAGNOSIS — I10 Essential (primary) hypertension: Secondary | ICD-10-CM

## 2017-02-06 DIAGNOSIS — M79605 Pain in left leg: Secondary | ICD-10-CM | POA: Insufficient documentation

## 2017-02-06 MED ORDER — CYCLOBENZAPRINE HCL 10 MG PO TABS: 10.0000 mg | ORAL_TABLET | Freq: Three times a day (TID) | ORAL | 0 refills | Status: DC | PRN

## 2017-02-06 MED ORDER — TRAMADOL HCL 50 MG PO TABS: 50.0000 mg | ORAL_TABLET | Freq: Four times a day (QID) | ORAL | 0 refills | Status: DC | PRN

## 2017-02-06 MED ORDER — PREDNISONE 10 MG (21) PO TBPK: ORAL_TABLET | ORAL | 0 refills | Status: DC

## 2017-02-06 NOTE — Patient Instructions (Signed)
Low Back Sprain Rehab  Ask your health care provider which exercises are safe for you. Do exercises exactly as told by your health care provider and adjust them as directed. It is normal to feel mild stretching, pulling, tightness, or discomfort as you do these exercises, but you should stop right away if you feel sudden pain or your pain gets worse. Do not begin these exercises until told by your health care provider.  Stretching and range of motion exercises  These exercises warm up your muscles and joints and improve the movement and flexibility of your back. These exercises also help to relieve pain, numbness, and tingling.  Exercise A: Lumbar rotation     1. Lie on your back on a firm surface and bend your knees.  2. Straighten your arms out to your sides so each arm forms an "L" shape with a side of your body (a 90 degree angle).  3. Slowly move both of your knees to one side of your body until you feel a stretch in your lower back. Try not to let your shoulders move off of the floor.  4. Hold for __________ seconds.  5. Tense your abdominal muscles and slowly move your knees back to the starting position.  6. Repeat this exercise on the other side of your body.  Repeat __________ times. Complete this exercise __________ times a day.  Exercise B: Prone extension on elbows     1. Lie on your abdomen on a firm surface.  2. Prop yourself up on your elbows.  3. Use your arms to help lift your chest up until you feel a gentle stretch in your abdomen and your lower back.  ? This will place some of your body weight on your elbows. If this is uncomfortable, try stacking pillows under your chest.  ? Your hips should stay down, against the surface that you are lying on. Keep your hip and back muscles relaxed.  4. Hold for __________ seconds.  5. Slowly relax your upper body and return to the starting position.  Repeat __________ times. Complete this exercise __________ times a day.  Strengthening exercises  These  exercises build strength and endurance in your back. Endurance is the ability to use your muscles for a long time, even after they get tired.  Exercise C: Pelvic tilt   1. Lie on your back on a firm surface. Bend your knees and keep your feet flat.  2. Tense your abdominal muscles. Tip your pelvis up toward the ceiling and flatten your lower back into the floor.  ? To help with this exercise, you may place a small towel under your lower back and try to push your back into the towel.  3. Hold for __________ seconds.  4. Let your muscles relax completely before you repeat this exercise.  Repeat __________ times. Complete this exercise __________ times a day.  Exercise D: Alternating arm and leg raises     1. Get on your hands and knees on a firm surface. If you are on a hard floor, you may want to use padding to cushion your knees, such as an exercise mat.  2. Line up your arms and legs. Your hands should be below your shoulders, and your knees should be below your hips.  3. Lift your left leg behind you. At the same time, raise your right arm and straighten it in front of you.  ? Do not lift your leg higher than your hip.  ? Do   not lift your arm higher than your shoulder.  ? Keep your abdominal and back muscles tight.  ? Keep your hips facing the ground.  ? Do not arch your back.  ? Keep your balance carefully, and do not hold your breath.  4. Hold for __________ seconds.  5. Slowly return to the starting position and repeat with your right leg and your left arm.  Repeat __________ times. Complete this exercise __________ times a day.  Exercise E: Abdominal set with straight leg raise     1. Lie on your back on a firm surface.  2. Bend one of your knees and keep your other leg straight.  3. Tense your abdominal muscles and lift your straight leg up, 4-6 inches (10-15 cm) off the ground.  4. Keep your abdominal muscles tight and hold for __________ seconds.  ? Do not hold your breath.  ? Do not arch your back. Keep it  flat against the ground.  5. Keep your abdominal muscles tense as you slowly lower your leg back to the starting position.  6. Repeat with your other leg.  Repeat __________ times. Complete this exercise __________ times a day.  Posture and body mechanics     Body mechanics refers to the movements and positions of your body while you do your daily activities. Posture is part of body mechanics. Good posture and healthy body mechanics can help to relieve stress in your body's tissues and joints. Good posture means that your spine is in its natural S-curve position (your spine is neutral), your shoulders are pulled back slightly, and your head is not tipped forward. The following are general guidelines for applying improved posture and body mechanics to your everyday activities.  Standing     · When standing, keep your spine neutral and your feet about hip-width apart. Keep a slight bend in your knees. Your ears, shoulders, and hips should line up.  · When you do a task in which you stand in one place for a long time, place one foot up on a stable object that is 2-4 inches (5-10 cm) high, such as a footstool. This helps keep your spine neutral.  Sitting     · When sitting, keep your spine neutral and keep your feet flat on the floor. Use a footrest, if necessary, and keep your thighs parallel to the floor. Avoid rounding your shoulders, and avoid tilting your head forward.  · When working at a desk or a computer, keep your desk at a height where your hands are slightly lower than your elbows. Slide your chair under your desk so you are close enough to maintain good posture.  · When working at a computer, place your monitor at a height where you are looking straight ahead and you do not have to tilt your head forward or downward to look at the screen.  Resting     · When lying down and resting, avoid positions that are most painful for you.  · If you have pain with activities such as sitting, bending, stooping, or  squatting (flexion-based activities), lie in a position in which your body does not bend very much. For example, avoid curling up on your side with your arms and knees near your chest (fetal position).  · If you have pain with activities such as standing for a long time or reaching with your arms (extension-based activities), lie with your spine in a neutral position and bend your knees slightly. Try the following   positions:  · Lying on your side with a pillow between your knees.  · Lying on your back with a pillow under your knees.  Lifting     · When lifting objects, keep your feet at least shoulder-width apart and tighten your abdominal muscles.  · Bend your knees and hips and keep your spine neutral. It is important to lift using the strength of your legs, not your back. Do not lock your knees straight out.  · Always ask for help to lift heavy or awkward objects.  This information is not intended to replace advice given to you by your health care provider. Make sure you discuss any questions you have with your health care provider.  Document Released: 12/04/2005 Document Revised: 08/10/2016 Document Reviewed: 09/15/2015  Elsevier Interactive Patient Education © 2017 [employer]

## 2017-02-06 NOTE — Progress Notes (Addendum)
Subjective:     Patient ID: Dale Howard, male   DOB: [birth year], 26 y.o.   MRN: [id number]  HPI  This is a 26 year old male who complains of back pain "for a while" that acutely worsened two days ago. He states in 2013 he was diagnosed with a herniated disc which was "never really treated" and states this pain is similar. He describes the pain as sharp and states he notices a shock sensation that goes down front of the left leg and down into the foot. Also relays some numbness over the pretibial area. Has tried Mobic without relief. Denies fevers, tobacoo use, coritcosteroid use, IV drug use, changes in bladder/bowel habits, history of cancer, and trauma.   Review of Systems All other ROS negative except those noted in the HPI.    Objective:   Physical Exam  Constitutional: He is oriented to person, place, and time. He appears well-developed and well-nourished. He appears distressed.  HENT:  Head: Normocephalic and atraumatic.  Neck: Normal range of motion. Neck supple.  Cardiovascular: Normal rate, regular rhythm and normal heart sounds.  Exam reveals no gallop and no friction rub.   No murmur heard. Pulmonary/Chest: Effort normal and breath sounds normal.  Abdominal: Soft. Bowel sounds are normal.  Musculoskeletal:       Right hip: He exhibits normal strength.       Left hip: He exhibits decreased strength. He exhibits normal range of motion.       Lumbar back: He exhibits tenderness.  Positive straight leg raise of left leg, negative of right.  Neurological: He is alert and oriented to person, place, and time. He displays abnormal reflex.  Skin: Skin is warm and dry. No rash noted. No erythema.  Psychiatric: He has a normal mood and affect. His behavior is normal.       Assessment:  Diagnoses and all orders for this visit:  Low back pain radiating to left leg -     cyclobenzaprine (FLEXERIL) 10 MG tablet; Take 1 tablet (10 mg total) by mouth 3 (three) times daily as needed for  muscle spasms. -     traMADol (ULTRAM) 50 MG tablet; Take 1 tablet (50 mg total) by mouth every 6 (six) hours as needed. -     predniSONE (STERAPRED UNI-PAK 21 TAB) 10 MG (21) TBPK tablet; 12 day taper pack, use as directed -     Ambulatory referral to Sports Medicine -     MR Lumbar Spine Wo Contrast; Future -     MR Lumbar Spine Wo Contrast  Essential hypertension, benign  Lumbar disc herniation  Morbid obesity (HCC)  Patient successfully treated for similar pain in December 2017 with Flexril, Tramadol, Prednisone pack, and Mobic so restarted this regimen. MRI ordered given that it has been 5 years since previous MRI and to evaluate for herniated disc or other cause of neurologic symptoms. Referral made to Sports Medicine for possible steroid injection for long term management of low back pain. I do think he would benefit from PT as well. Cost is an issue. Patient instructed to call or come back in if he he begins to experience numbness of his buttocks and inner thighs or loses control of his bowel and/or bladder. Patient written out of work for yesterday, today, and tomorrow.  Pt was strongly encouraged to lose weight. Discussed healthy diet and exercise.   Pt strongly encouraged to start taking BP medications daily. I am sure BP elevation is due to pain  as well but he needs to consider taking medication daily. Recheck in 2 weeks.

## 2017-02-12 ENCOUNTER — Ambulatory Visit (INDEPENDENT_AMBULATORY_CARE_PROVIDER_SITE_OTHER): Payer: BLUE CROSS/BLUE SHIELD

## 2017-02-12 DIAGNOSIS — M5116 Intervertebral disc disorders with radiculopathy, lumbar region: Secondary | ICD-10-CM

## 2017-02-12 DIAGNOSIS — M5117 Intervertebral disc disorders with radiculopathy, lumbosacral region: Secondary | ICD-10-CM

## 2017-02-13 NOTE — Progress Notes (Signed)
Call pt: you do have some disc herniations. I would like to refer you to ortho. Are you ok with this?  Is your back pain better today?

## 2017-02-19 ENCOUNTER — Ambulatory Visit: Payer: BLUE CROSS/BLUE SHIELD | Admitting: Physician Assistant

## 2017-02-20 ENCOUNTER — Other Ambulatory Visit: Payer: Self-pay | Admitting: *Deleted

## 2017-02-20 ENCOUNTER — Ambulatory Visit (INDEPENDENT_AMBULATORY_CARE_PROVIDER_SITE_OTHER): Payer: BLUE CROSS/BLUE SHIELD | Admitting: Sports Medicine

## 2017-02-20 ENCOUNTER — Encounter: Payer: Self-pay | Admitting: Sports Medicine

## 2017-02-20 DIAGNOSIS — M5126 Other intervertebral disc displacement, lumbar region: Secondary | ICD-10-CM

## 2017-02-20 DIAGNOSIS — I10 Essential (primary) hypertension: Secondary | ICD-10-CM

## 2017-02-20 MED ORDER — PREDNISONE 10 MG (48) PO TBPK: ORAL_TABLET | Freq: Every day | ORAL | 0 refills | Status: DC

## 2017-02-20 MED ORDER — LORCASERIN HCL 10 MG PO TABS
10.0000 mg | ORAL_TABLET | Freq: Two times a day (BID) | ORAL | 1 refills | Status: DC
Start: 2017-02-20 — End: 2018-01-03

## 2017-02-20 MED ORDER — LISINOPRIL 40 MG PO TABS: 40.0000 mg | ORAL_TABLET | Freq: Every day | ORAL | 1 refills | Status: DC

## 2017-02-20 NOTE — Assessment & Plan Note (Signed)
With progressive weakness and left-sided foot drop, there is a large disc sequestration at the L4-L5 level with clear compression of the left L4 and L5 nerve roots. Considering his foot drop I do think he needs urgent neurosurgical evaluation. Adding a prednisone taper, his burst has improved his symptoms to some degree. Further management per neurosurgery.

## 2017-02-20 NOTE — Progress Notes (Signed)
   Subjective:    I'm seeing this patient as a consultation for:  Dale GawJade Breeback, PA-C  CC: Herniated disc  HPI: In 2012 this pleasant 26 year old male was pushing a heavy object, it rolled back on him, he had immediate pain in his back, he woke up the next morning with radicular pain down the left leg to the top of the left foot.  He never had bowel or bladder dysfunction or saddle numbness, he has helped had some progressive weakness of his left leg. Overall he did well, but more recently within the past couple of weeks he's noted increasing weakness and pain. Still has no bowel or bladder dysfunction or saddle numbness.  Past medical history:  Negative.  See flowsheet/record as well for more information.  Surgical history: Negative.  See flowsheet/record as well for more information.  Family history: Negative.  See flowsheet/record as well for more information.  Social history: Negative.  See flowsheet/record as well for more information.  Allergies, and medications have been entered into the medical record, reviewed, and no changes needed.   Review of Systems: No headache, visual changes, nausea, vomiting, diarrhea, constipation, dizziness, abdominal pain, skin rash, fevers, chills, night sweats, weight loss, swollen lymph nodes, body aches, joint swelling, muscle aches, chest pain, shortness of breath, mood changes, visual or auditory hallucinations.   Objective:   General: Well Developed, well nourished, and in no acute distress.  Neuro/Psych: Alert and oriented x3, extra-ocular muscles intact, able to move all 4 extremities, sensation grossly intact. Skin: Warm and dry, no rashes noted.  Respiratory: Not using accessory muscles, speaking in full sentences, trachea midline.  Cardiovascular: Pulses palpable, no extremity edema. Abdomen: Does not appear distended. Back Exam:  Inspection: Unremarkable  Motion: Flexion 45 deg, Extension 45 deg, Side Bending to 45 deg bilaterally,    Rotation to 45 deg bilaterally  SLR laying: Positive with reproduction of left-sided radicular symptoms  XSLR laying: Negative  Palpable tenderness: None. FABER: negative. Sensory change: Gross sensation intact to all lumbar and sacral dermatomes.  Reflexes: 2+ at both patellar tendons, 2+ at achilles tendons, Babinski's downgoing.  Strength at foot  Plantar-flexion: 5/5 Dorsi-flexion: 4-/5 Eversion: 5/5 Inversion: 5/5  Leg strength  Quad: 5/5 Hamstring: 5/5 Hip flexor: 5/5 Hip abductors: 5/5  Gait unremarkable.  Impression and Recommendations:   This case required medical decision making of moderate complexity.  Lumbar disc herniation With progressive weakness and left-sided foot drop, there is a large disc sequestration at the L4-L5 level with clear compression of the left L4 and L5 nerve roots. Considering his foot drop I do think he needs urgent neurosurgical evaluation. Adding a prednisone taper, his burst has improved his symptoms to some degree. Further management per neurosurgery.

## 2017-03-15 ENCOUNTER — Telehealth: Payer: Self-pay

## 2017-03-15 NOTE — Telephone Encounter (Signed)
He probably means an epidural, I can order that if he would like, [facility]  I am awaiting Dr. Barnett ApplebaumJones's office note but considering his weakness I do think Dr. Yetta BarreJones needs to reconsider surgical intervention.

## 2017-03-15 NOTE — Telephone Encounter (Signed)
Referral was made on 12/25/16 for [facility] [place] [facility]? Does he want to go somewhere else. If so can place referral or follow up on previous referral placed.

## 2017-03-15 NOTE — Telephone Encounter (Signed)
Pt called and said he saw Dr Yetta BarreJones, neurosurgeon.  He suggested a cortisone shot, and the patient was asking if that can be done here in this office.  Please advise.   Jade- Pt also said he needs a referral for weight loss surgery.  Please advise.

## 2017-03-15 NOTE — Telephone Encounter (Signed)
It was sent, but patient said they did not receive it.  Spoke with cindy and she resent.

## 2017-03-15 NOTE — Telephone Encounter (Signed)
Left message for pt, and told to call office if further questions arise.

## 2018-01-03 ENCOUNTER — Encounter: Payer: Self-pay | Admitting: Physician Assistant

## 2018-01-03 ENCOUNTER — Ambulatory Visit (INDEPENDENT_AMBULATORY_CARE_PROVIDER_SITE_OTHER): Payer: BLUE CROSS/BLUE SHIELD

## 2018-01-03 ENCOUNTER — Ambulatory Visit (INDEPENDENT_AMBULATORY_CARE_PROVIDER_SITE_OTHER): Payer: Self-pay | Admitting: Physician Assistant

## 2018-01-03 DIAGNOSIS — M79605 Pain in left leg: Secondary | ICD-10-CM

## 2018-01-03 DIAGNOSIS — I10 Essential (primary) hypertension: Secondary | ICD-10-CM

## 2018-01-03 DIAGNOSIS — M545 Low back pain, unspecified: Secondary | ICD-10-CM

## 2018-01-03 DIAGNOSIS — R05 Cough: Secondary | ICD-10-CM

## 2018-01-03 DIAGNOSIS — R053 Chronic cough: Secondary | ICD-10-CM | POA: Insufficient documentation

## 2018-01-03 DIAGNOSIS — S139XXA Sprain of joints and ligaments of unspecified parts of neck, initial encounter: Secondary | ICD-10-CM

## 2018-01-03 MED ORDER — MELOXICAM 15 MG PO TABS: 15.0000 mg | ORAL_TABLET | Freq: Every day | ORAL | 0 refills | Status: DC

## 2018-01-03 MED ORDER — CYCLOBENZAPRINE HCL 10 MG PO TABS: 10.0000 mg | ORAL_TABLET | Freq: Three times a day (TID) | ORAL | 0 refills | Status: DC | PRN

## 2018-01-03 MED ORDER — LISINOPRIL 40 MG PO TABS: 40.0000 mg | ORAL_TABLET | Freq: Every day | ORAL | 1 refills | Status: DC

## 2018-01-03 MED ORDER — AZITHROMYCIN 250 MG PO TABS
ORAL_TABLET | ORAL | 0 refills | Status: DC
Start: 2018-01-03 — End: 2018-01-18

## 2018-01-03 MED ORDER — PREDNISONE 20 MG PO TABS: 40.0000 mg | ORAL_TABLET | Freq: Every day | ORAL | 0 refills | Status: AC

## 2018-01-03 NOTE — Patient Instructions (Addendum)
For your neck and back: - Prednisone daily for 5 days. Then Meloxicam daily as needed for pain - Cyclobenzaprine at bedtime - TENS unit - ice or heat (whichever feels better) x 20 minutes, 3-4 times per day - physical therapy    For your cough: - Chest x-ray today - Azithromycin for 5 days - Mucinex to make cough more productive - Flonase nasal spray to dry up nasal secretions / improve congestion   Cervical Sprain A cervical sprain is a stretch or tear in one or more of the tough, cord-like tissues that connect bones (ligaments) in the neck. Cervical sprains can range from mild to severe. Severe cervical sprains can cause the spinal bones (vertebrae) in the neck to be unstable. This can lead to spinal cord damage and can result in serious nervous system problems. The amount of time that it takes for a cervical sprain to get better depends on the cause and extent of the injury. Most cervical sprains heal in 4-6 weeks. What are the causes? Cervical sprains may be caused by an injury (trauma), such as from a motor vehicle accident, a fall, or sudden forward and backward whipping movement of the head and neck (whiplash injury). Mild cervical sprains may be caused by wear and tear over time, such as from poor posture, sitting in a chair that does not provide support, or looking up or down for long periods of time. What increases the risk? The following factors may make you more likely to develop this condition:  Participating in activities that have a high risk of trauma to the neck. These include contact sports, auto racing, gymnastics, and diving.  Taking risks when driving or riding in a motor vehicle, such as speeding.  Having osteoarthritis of the spine.  Having poor strength and flexibility of the neck.  A previous neck injury.  Having poor posture.  Spending a lot of time in certain positions that put stress on the neck, such as sitting at a computer for long periods of  time.  What are the signs or symptoms? Symptoms of this condition include:  Pain, soreness, stiffness, tenderness, swelling, or a burning sensation in the front, back, or sides of the neck.  Sudden tightening of neck muscles that you cannot control (muscle spasms).  Pain in the shoulders or upper back.  Limited ability to move the neck.  Headache.  Dizziness.  Nausea.  Vomiting.  Weakness, numbness, or tingling in a hand or an arm.  Symptoms may develop right away after injury, or they may develop over a few days. In some cases, symptoms may go away with treatment and return (recur) over time. How is this diagnosed? This condition may be diagnosed based on:  Your medical history.  Your symptoms.  Any recent injuries or known neck problems that you have, such as arthritis in the neck.  A physical exam.  Imaging tests, such as: ? X-rays. ? MRI. ? CT scan.  How is this treated? This condition is treated by resting and icing the injured area and doing physical therapy exercises. Depending on the severity of your condition, treatment may also include:  Keeping your neck in place (immobilized) for periods of time. This may be done using: ? A cervical collar. This supports your chin and the back of your head. ? A cervical traction device. This is a sling that holds up your head. This removes weight and pressure from your neck, and it may help to relieve pain.  Medicines that  help to relieve pain and inflammation.  Medicines that help to relax your muscles (muscle relaxants).  Surgery. This is rare.  Follow these instructions at home: If you have a cervical collar:  Wear it as told by your health care provider. Do not remove the collar unless instructed by your health care provider.  Ask your health care provider before you make any adjustments to your collar.  If you have long hair, keep it outside of the collar.  Ask your health care provider if you can remove  the collar for cleaning and bathing. If you are allowed to remove the collar for cleaning or bathing: ? Follow instructions from your health care provider about how to remove the collar safely. ? Clean the collar by wiping it with mild soap and water and drying it completely. ? If your collar has removable pads, remove them every 1-2 days and wash them by hand with soap and water. Let them air-dry completely before you put them back in the collar. ? Check your skin under the collar for irritation or sores. If you see any, tell your health care provider. Managing pain, stiffness, and swelling  If directed, use a cervical traction device as told by your health care provider.  If directed, apply heat to the affected area before you do your physical therapy or as often as told by your health care provider. Use the heat source that your health care provider recommends, such as a moist heat pack or a heating pad. ? Place a towel between your skin and the heat source. ? Leave the heat on for 20-30 minutes. ? Remove the heat if your skin turns bright red. This is especially important if you are unable to feel pain, heat, or cold. You may have a greater risk of getting burned.  If directed, put ice on the affected area: ? Put ice in a plastic bag. ? Place a towel between your skin and the bag. ? Leave the ice on for 20 minutes, 2-3 times a day. Activity  Do not drive while wearing a cervical collar. If you do not have a cervical collar, ask your health care provider if it is safe to drive while your neck heals.  Do not drive or use heavy machinery while taking prescription pain medicine or muscle relaxants, unless your health care provider approves.  Do not lift anything that is heavier than 10 lb (4.5 kg) until your health care provider tells you that it is safe.  Rest as directed by your health care provider. Avoid positions and activities that make your symptoms worse. Ask your health care  provider what activities are safe for you.  If physical therapy was prescribed, do exercises as told by your health care provider or physical therapist. General instructions  Take over-the-counter and prescription medicines only as told by your health care provider.  Do not use any products that contain nicotine or tobacco, such as cigarettes and e-cigarettes. These can delay healing. If you need help quitting, ask your health care provider.  Keep all follow-up visits as told by your health care provider or physical therapist. This is important. How is this prevented? To prevent a cervical sprain from happening again:  Use and maintain good posture. Make any needed adjustments to your workstation to help you use good posture.  Exercise regularly as directed by your health care provider or physical therapist.  Avoid risky activities that may cause a cervical sprain.  Contact a health care provider  if:  You have symptoms that get worse or do not get better after 2 weeks of treatment.  You have pain that gets worse or does not get better with medicine.  You develop new, unexplained symptoms.  You have sores or irritated skin on your neck from wearing your cervical collar. Get help right away if:  You have severe pain.  You develop numbness, tingling, or weakness in any part of your body.  You cannot move a part of your body (you have paralysis).  You have neck pain along with: ? Severe dizziness. ? Headache. Summary  A cervical sprain is a stretch or tear in one or more of the tough, cord-like tissues that connect bones (ligaments) in the neck.  Cervical sprains may be caused by an injury (trauma), such as from a motor vehicle accident, a fall, or sudden forward and backward whipping movement of the head and neck (whiplash injury).  Symptoms may develop right away after injury, or they may develop over a few days.  This condition is treated by resting and icing the injured  area and doing physical therapy exercises. This information is not intended to replace advice given to you by your health care provider. Make sure you discuss any questions you have with your health care provider. Document Released: 10/01/2007 Document Revised: 08/02/2016 Document Reviewed: 08/02/2016 Elsevier Interactive Patient Education  [employer].
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
## 2018-01-04 NOTE — Progress Notes (Signed)
Chest x-ray is normal Treatment plan does not change Follow-up with PCP if cough does not improve

## 2018-01-09 ENCOUNTER — Encounter: Payer: Self-pay | Admitting: Physical Therapy

## 2018-01-09 ENCOUNTER — Ambulatory Visit (INDEPENDENT_AMBULATORY_CARE_PROVIDER_SITE_OTHER): Payer: BLUE CROSS/BLUE SHIELD | Admitting: Physical Therapy

## 2018-01-09 DIAGNOSIS — M542 Cervicalgia: Secondary | ICD-10-CM

## 2018-01-09 DIAGNOSIS — M62838 Other muscle spasm: Secondary | ICD-10-CM

## 2018-01-09 DIAGNOSIS — M545 Low back pain, unspecified: Secondary | ICD-10-CM

## 2018-01-09 DIAGNOSIS — M436 Torticollis: Secondary | ICD-10-CM | POA: Diagnosis not present

## 2018-01-09 NOTE — Patient Instructions (Addendum)
Flexibility: Neck Retraction    Pull head straight back, keeping eyes and jaw level. Repeat __3-5__ times per set. Do __1__ sets per session. Do __7-8__ sessions per day.  Strengthening: Shoulder Shrug (Phase 1)    Shrug shoulders up and down, forward and backward. Repeat _3-5___ times per set. Do __1_ sets per session. Do __7-8__ sessions per day.  Scapular Retraction (Standing)    With arms at sides, pinch shoulder blades together. Repeat __3-5__ times per set. Do __1__ sets per session. Do __7-8__ sessions per day.   Flexibility: Upper Trapezius Stretch    Gently grasp right side of head while reaching behind back with other hand. Tilt head away until a gentle stretch is felt. Hold _20-30___ seconds. Repeat __1__ times per set. Do __1__ sets per session. Do _1-2___ sessions per day. Repeat on the other side
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
## 2018-01-16 ENCOUNTER — Ambulatory Visit (INDEPENDENT_AMBULATORY_CARE_PROVIDER_SITE_OTHER): Payer: BLUE CROSS/BLUE SHIELD | Admitting: Physical Therapy

## 2018-01-16 DIAGNOSIS — M436 Torticollis: Secondary | ICD-10-CM

## 2018-01-16 DIAGNOSIS — M545 Low back pain, unspecified: Secondary | ICD-10-CM

## 2018-01-16 DIAGNOSIS — M542 Cervicalgia: Secondary | ICD-10-CM | POA: Diagnosis not present

## 2018-01-16 NOTE — Therapy (Signed)
[facility] [place] [address] [place], [place], [address] Phone: [phone]   Fax:  [phone]  Physical Therapy Treatment  Patient Details  Name: Senan Urey MRN: [id number] Date of Birth: Aug 16, 1991 Referring Provider: Rosita Kea, PA-C   Encounter Date: 01/16/2018  PT End of Session - 01/16/18 1533    Visit Number  2    Number of Visits  8    Date for PT Re-Evaluation  02/06/18    PT Start Time  1521    PT Stop Time  1614    PT Time Calculation (min)  53 min    Activity Tolerance  Patient tolerated treatment well;No increased pain    Behavior During Therapy  [facility] for tasks assessed/performed       No past medical history on file.  No past surgical history on file.  There were no vitals filed for this visit.  Subjective Assessment - 01/16/18 1529    Subjective  Pt reports the tightness in his Lt neck has improved.  He has been doing exercises daily.  He started having pain in Lt low back and glute at end of day that started 4 days ago.      Currently in Pain?  No/denies "just tightness"     Pain Score  0-No pain         [facility] PT Assessment - 01/16/18 0001      Assessment   Medical Diagnosis  cervical and lumbar pain    Referring Provider  Rosita Kea, PA-C    Onset Date/Surgical Date  01/02/18    Hand Dominance  Left    Next MD Visit  PRN    Prior Therapy  none      AROM   Cervical Flexion  56    Cervical Extension  50    Cervical - Right Side Bend  45    Cervical - Left Side Bend  49    Cervical - Right Rotation  69    Cervical - Left Rotation  68 with tightness in Rt neck       [facility] Adult PT Treatment/Exercise - 01/16/18 0001      Self-Care   Self-Care  Other Self-Care Comments    Other Self-Care Comments   Pt educated on self massage to low back, pec, and upper back to decrease tightness and pain.  Pt verbalized understanding and returned demo.       Neck Exercises: Machines for  Strengthening   UBE (Upper Arm Bike)  L2: 1 min forward, 1 min backward.       Neck Exercises: Seated   Neck Retraction  10 reps;3 secs chin tuck; demo for improved form.     Shoulder Rolls  Backwards;Forwards;5 reps    Other Seated Exercise  scap retraction, 5 reps x 5 sec       Lumbar Exercises: Stretches   Passive Hamstring Stretch  Left;Right;3 reps;20 seconds seated     Piriformis Stretch  Left;3 reps;20 seconds seated      Moist Heat Therapy   Number Minutes Moist Heat  15 Minutes    Moist Heat Location  Cervical      Electrical Stimulation   Electrical Stimulation Location  cervical paraspinals and upper trap    Electrical Stimulation Action  IFC    Electrical Stimulation Parameters   to tolerance     Electrical Stimulation Goals  Tone;Pain      Neck Exercises: Stretches   Upper Trapezius Stretch  Left;Right;20 seconds    Other Neck Stretches  3 position doorway stretch x 30 sec x 2 reps each position.                   PT Long Term Goals - 01/09/18 [id number]      PT LONG TERM GOAL #1   Title  I with advanced HEP ( 02/06/18)     Time  4    Period  Weeks    Status  New      PT LONG TERM GOAL #2   Title  demo Rt cervical rotation =/> 70 degress ( 02/06/18)     Time  4    Period  Weeks    Status  New      PT LONG TERM GOAL #3   Title  report overall reduction of pain in back to his baseline level of function ( 02/06/18)     Time  4    Period  Weeks    Status  New      PT LONG TERM GOAL #4   Title  improve FOTO =/< 21% limited ( 02/06/18)     Time  4    Period  Weeks    Status  New            Plan - 01/16/18 1559    Clinical Impression Statement  Pt demonstrated cervical ROM with min to no pain.  He required minor cues on form for exercise.  Pt shown LE stretches to begin addressing new LBP.  Pt progressing towards goals.     Rehab Potential  Good    PT Frequency  2x / week    PT Duration  4 weeks    PT Treatment/Interventions  Neuromuscular  re-education;Dry needling;Manual techniques;Moist Heat;Traction;Ultrasound;Therapeutic activities;Patient/family education;Taping;Therapeutic exercise;Cryotherapy;Electrical Stimulation;Passive range of motion    PT Next Visit Plan  assess LBP, cervical mobs. continue postural strengthening.     Consulted and Agree with Plan of Care  Patient       Patient will benefit from skilled therapeutic intervention in order to improve the following deficits and impairments:  Pain, Increased muscle spasms, Decreased range of motion, Hypomobility  Visit Diagnosis: Cervicalgia  Acute bilateral low back pain without sciatica  Stiffness of cervical spine     Problem List Patient Active Problem List   Diagnosis Date Noted  . Persistent cough for 3 weeks or longer 01/03/2018  . Acute cervical sprain 01/03/2018  . Low back pain radiating to left leg 02/06/2017  . Drug-induced erectile dysfunction 10/13/2016  . Morbid obesity (HCC) 06/28/2016  . Male hypogonadism 06/28/2016  . Abnormal weight gain 06/28/2016  . Essential hypertension, benign 06/05/2016  . Elevated hemoglobin (HCC) 06/05/2016  . Low testosterone 05/19/2016  . Elevated ALT measurement 05/19/2016  . Vitamin D deficiency 05/19/2016  . Lumbar disc herniation 05/05/2016  . Morbid obesity due to excess calories (HCC) 05/05/2016  . Other fatigue 05/05/2016   Mayer CamelJennifer Carlson-Long, PTA 01/16/18 5:04 PM  [facility] [place] [address] [place], [place], [address] Phone: [phone]   Fax:  [phone]  Name: York CeriseOsman Ola MRN: [id number] Date of Birth: 1991/01/26

## 2018-01-16 NOTE — Patient Instructions (Signed)
Scapula Adduction With Pectorals, Low   Stand in doorframe with palms against frame and arms at 45. Lean forward and squeeze shoulder blades. Hold _30__ seconds. Repeat _2-3__ times per session. Do _1-2__ sessions per day.   Scapula Adduction With Pectorals, Mid-Range   Stand in doorframe with palms against frame and arms at 90. Lean forward and squeeze shoulder blades. Hold _30__ seconds. Repeat _2-3__ times per session. Do _1-2__ sessions per day.  \Scapula Adduction With Pectorals, High   Stand in doorframe with palms against frame and arms at 120. Lean forward and squeeze shoulder blades. Hold __30_ seconds. Repeat _2-3__ times per session. Do _1-2__ sessions per day.  HIP: Hamstrings - Short Sitting    Rest leg on raised surface. Keep knee straight. Lift chest. Hold _30__ seconds. _2__ reps per set, _2__ sets per day  Hip Stretch    Put right ankle over left knee. Let right knee fall downward, but keep ankle in place. Feel the stretch in hip. May push down gently with hand to feel stretch. Hold __30__ seconds while counting out loud. Repeat with other leg. Repeat _2___ times.   [facility] [address] [place], [place] [address]  [phone] (office) [phone] (fax)

## 2018-01-18 ENCOUNTER — Ambulatory Visit (INDEPENDENT_AMBULATORY_CARE_PROVIDER_SITE_OTHER): Payer: BLUE CROSS/BLUE SHIELD | Admitting: Physician Assistant

## 2018-01-18 ENCOUNTER — Ambulatory Visit (INDEPENDENT_AMBULATORY_CARE_PROVIDER_SITE_OTHER): Payer: BLUE CROSS/BLUE SHIELD | Admitting: Physical Therapy

## 2018-01-18 VITALS — BP 145/81 | HR 82 | Wt 331.0 lb

## 2018-01-18 DIAGNOSIS — M545 Low back pain, unspecified: Secondary | ICD-10-CM

## 2018-01-18 DIAGNOSIS — M62838 Other muscle spasm: Secondary | ICD-10-CM

## 2018-01-18 DIAGNOSIS — M542 Cervicalgia: Secondary | ICD-10-CM

## 2018-01-18 DIAGNOSIS — M436 Torticollis: Secondary | ICD-10-CM | POA: Diagnosis not present

## 2018-01-18 DIAGNOSIS — I10 Essential (primary) hypertension: Secondary | ICD-10-CM | POA: Diagnosis not present

## 2018-01-18 MED ORDER — AMLODIPINE BESYLATE 10 MG PO TABS
10.0000 mg | ORAL_TABLET | Freq: Every day | ORAL | 3 refills | Status: DC
Start: 2018-01-18 — End: 2018-02-08

## 2018-01-18 NOTE — Patient Instructions (Signed)
Take amlodipine in addition with the lisinopril. Follow up with Jade in a few weeks for blood pressure recheck.   DASH Eating Plan DASH stands for "Dietary Approaches to Stop Hypertension." The DASH eating plan is a healthy eating plan that has been shown to reduce high blood pressure (hypertension). It may also reduce your risk for type 2 diabetes, heart disease, and stroke. The DASH eating plan may also help with weight loss. What are tips for following this plan? General guidelines  Avoid eating more than 2,300 mg (milligrams) of salt (sodium) a day. If you have hypertension, you may need to reduce your sodium intake to 1,500 mg a day.  Limit alcohol intake to no more than 1 drink a day for nonpregnant women and 2 drinks a day for men. One drink equals 12 oz of beer, 5 oz of wine, or 1 oz of hard liquor.  Work with your health care provider to maintain a healthy body weight or to lose weight. Ask what an ideal weight is for you.  Get at least 30 minutes of exercise that causes your heart to beat faster (aerobic exercise) most days of the week. Activities may include walking, swimming, or biking.  Work with your health care provider or diet and nutrition specialist (dietitian) to adjust your eating plan to your individual calorie needs. Reading food labels  Check food labels for the amount of sodium per serving. Choose foods with less than 5 percent of the Daily Value of sodium. Generally, foods with less than 300 mg of sodium per serving fit into this eating plan.    To find whole grains, look for the word "whole" as the first word in the ingredient list. Shopping  Buy products labeled as "low-sodium" or "no salt added."  Buy fresh foods. Avoid canned foods and premade or frozen meals. Cooking  Avoid adding salt when cooking. Use salt-free seasonings or herbs instead of table salt or sea salt. Check with your health care provider or pharmacist before using salt substitutes.  Do  not fry foods. Cook foods using healthy methods such as baking, boiling, grilling, and broiling instead.  Cook with heart-healthy oils, such as olive, canola, soybean, or sunflower oil. Meal planning    Eat a balanced diet that includes: ? 5 or more servings of fruits and vegetables each day. At each meal, try to fill half of your plate with fruits and vegetables. ? Up to 6-8 servings of whole grains each day. ? Less than 6 oz of lean meat, poultry, or fish each day. A 3-oz serving of meat is about the same size as a deck of cards. One egg equals 1 oz. ? 2 servings of low-fat dairy each day. ? A serving of nuts, seeds, or beans 5 times each week. ? Heart-healthy fats. Healthy fats called Omega-3 fatty acids are found in foods such as flaxseeds and coldwater fish, like sardines, salmon, and mackerel.  Limit how much you eat of the following: ? Canned or prepackaged foods. ? Food that is high in trans fat, such as fried foods. ? Food that is high in saturated fat, such as fatty meat. ? Sweets, desserts, sugary drinks, and other foods with added sugar. ? Full-fat dairy products.  Do not salt foods before eating.  Try to eat at least 2 vegetarian meals each week.  Eat more home-cooked food and less restaurant, buffet, and fast food.  When eating at a restaurant, ask that your food be prepared with less salt  or no salt, if possible. What foods are recommended? The items listed may not be a complete list. Talk with your dietitian about what dietary choices are best for you. Grains Whole-grain or whole-wheat bread. Whole-grain or whole-wheat pasta. Brown rice. Modena Morrow. Bulgur. Whole-grain and low-sodium cereals. Pita bread. Low-fat, low-sodium crackers. Whole-wheat flour tortillas. Vegetables Fresh or frozen vegetables (raw, steamed, roasted, or grilled). Low-sodium or reduced-sodium tomato and vegetable juice. Low-sodium or reduced-sodium tomato sauce and tomato paste. Low-sodium  or reduced-sodium canned vegetables. Fruits All fresh, dried, or frozen fruit. Canned fruit in natural juice (without added sugar). Meat and other protein foods Skinless chicken or [place]. Ground chicken or [place]. Pork with fat trimmed off. Fish and seafood. Egg whites. Dried beans, peas, or lentils. Unsalted nuts, nut butters, and seeds. Unsalted canned beans. Lean cuts of beef with fat trimmed off. Low-sodium, lean deli meat. Dairy Low-fat (1%) or fat-free (skim) milk. Fat-free, low-fat, or reduced-fat cheeses. Nonfat, low-sodium ricotta or cottage cheese. Low-fat or nonfat yogurt. Low-fat, low-sodium cheese. Fats and oils Soft margarine without trans fats. Vegetable oil. Low-fat, reduced-fat, or light mayonnaise and salad dressings (reduced-sodium). Canola, safflower, olive, soybean, and sunflower oils. Avocado. Seasoning and other foods Herbs. Spices. Seasoning mixes without salt. Unsalted popcorn and pretzels. Fat-free sweets. What foods are not recommended? The items listed may not be a complete list. Talk with your dietitian about what dietary choices are best for you. Grains Baked goods made with fat, such as croissants, muffins, or some breads. Dry pasta or rice meal packs. Vegetables Creamed or fried vegetables. Vegetables in a cheese sauce. Regular canned vegetables (not low-sodium or reduced-sodium). Regular canned tomato sauce and paste (not low-sodium or reduced-sodium). Regular tomato and vegetable juice (not low-sodium or reduced-sodium). Angie Fava. Olives. Fruits Canned fruit in a light or heavy syrup. Fried fruit. Fruit in cream or butter sauce. Meat and other protein foods Fatty cuts of meat. Ribs. Fried meat. Berniece Salines. Sausage. Bologna and other processed lunch meats. Salami. Fatback. Hotdogs. Bratwurst. Salted nuts and seeds. Canned beans with added salt. Canned or smoked fish. Whole eggs or egg yolks. Chicken or [place] with skin. Dairy Whole or 2% milk, cream, and  half-and-half. Whole or full-fat cream cheese. Whole-fat or sweetened yogurt. Full-fat cheese. Nondairy creamers. Whipped toppings. Processed cheese and cheese spreads. Fats and oils Butter. Stick margarine. Lard. Shortening. Ghee. Bacon fat. Tropical oils, such as coconut, palm kernel, or palm oil. Seasoning and other foods Salted popcorn and pretzels. Onion salt, garlic salt, seasoned salt, table salt, and sea salt. Worcestershire sauce. Tartar sauce. Barbecue sauce. Teriyaki sauce. Soy sauce, including reduced-sodium. Steak sauce. Canned and packaged gravies. Fish sauce. Oyster sauce. Cocktail sauce. Horseradish that you find on the shelf. Ketchup. Mustard. Meat flavorings and tenderizers. Bouillon cubes. Hot sauce and Tabasco sauce. Premade or packaged marinades. Premade or packaged taco seasonings. Relishes. Regular salad dressings. Where to find more information:  National Heart, Lung, and [place]: https://wilson-eaton.com/  American Heart Association: www.heart.org Summary  The DASH eating plan is a healthy eating plan that has been shown to reduce high blood pressure (hypertension). It may also reduce your risk for type 2 diabetes, heart disease, and stroke.  With the DASH eating plan, you should limit salt (sodium) intake to 2,300 mg a day. If you have hypertension, you may need to reduce your sodium intake to 1,500 mg a day.  When on the DASH eating plan, aim to eat more fresh fruits and vegetables, whole grains, lean proteins, low-fat dairy,  and heart-healthy fats.  Work with your health care provider or diet and nutrition specialist (dietitian) to adjust your eating plan to your individual calorie needs. This information is not intended to replace advice given to you by your health care provider. Make sure you discuss any questions you have with your health care provider. Document Released: 11/23/2011 Document Revised: 11/27/2016 Document Reviewed: 11/27/2016 Elsevier Interactive  Patient Education  [employer].

## 2018-01-18 NOTE — Therapy (Signed)
[facility] [address] [place], [place], [address] Phone: [phone]   Fax:  [phone]  Physical Therapy Treatment  Patient Details  Name: Dale Howard MRN: [id number] Date of Birth: Jul 06, 1991 Referring Provider: Rosita Keaharlie Cummings, PA-C   Encounter Date: 01/18/2018  PT End of Session - 01/18/18 1145    Visit Number  3    Number of Visits  8    Date for PT Re-Evaluation  02/06/18    PT Start Time  1107    PT Stop Time  1141    PT Time Calculation (min)  34 min    Activity Tolerance  Patient tolerated treatment well       No past medical history on file.  No past surgical history on file.  There were no vitals filed for this visit.  Subjective Assessment - 01/18/18 1111    Subjective  neck and mid back are feeling good, low back sore first thing in AM and at end of day. Loosens up during the day    Currently in Pain?  No/denies         [name] - 01/18/18 0001      AROM   AROM Assessment Site  Lumbar    Lumbar Flexion  mid shin    Lumbar Extension  WNL    Lumbar - Right Side Bend  WNL    Lumbar - Left Side Bend  WNL    Lumbar - Right Rotation  WNL    Lumbar - Left Rotation  WNL      Palpation   Spinal mobility  good mobiity in lumbar spine except pain at Lt L4 UPA    Palpation comment  tender and tight in the Lt QL and upper gluts.                   [facility] Adult PT Treatment/Exercise - 01/18/18 0001      Modalities   Modalities  Ultrasound heat and stim not needed today.       Ultrasound   Ultrasound Location  Lt lower lumbar and upper gluts    Ultrasound Parameters  combo US/stim 100%, 1.760mHz, 1.5w/cm2    Ultrasound Goals  Pain      Manual Therapy   Joint Mobilization  Lumbar mobs grade III with focus at L4 articular pillar and transverse process    Soft tissue mobilization  STM to Lt QL, lumbar paraspinals and upper gluts                  PT Long Term  Goals - 01/09/18 [id number]      PT LONG TERM GOAL #1   Title  I with advanced HEP ( 02/06/18)     Time  4    Period  Weeks    Status  New      PT LONG TERM GOAL #2   Title  demo Rt cervical rotation =/> 70 degress ( 02/06/18)     Time  4    Period  Weeks    Status  New      PT LONG TERM GOAL #3   Title  report overall reduction of pain in back to his baseline level of function ( 02/06/18)     Time  4    Period  Weeks    Status  New      PT LONG TERM GOAL #4   Title  improve FOTO =/< 21% limited ( 02/06/18)  Time  4    Period  Weeks    Status  New            Plan - 01/18/18 1145    Clinical Impression Statement  Pt with limited lumbar flexion at beginning of treatment due to Lt sided low back pain, after treatment he was able to reach to the floor with only hamstring tightness. Making great progress.  Encouraged patient to return to all his normal activity before his next visit and assess back and neck.     Rehab Potential  Good    PT Frequency  2x / week    PT Duration  4 weeks    PT Treatment/Interventions  Neuromuscular re-education;Dry needling;Manual techniques;Moist Heat;Traction;Ultrasound;Therapeutic activities;Patient/family education;Taping;Therapeutic exercise;Cryotherapy;Electrical Stimulation;Passive range of motion    PT Next Visit Plan  assess for needs. cervical/thoracic Salli Real discuss plans for discharge    Consulted and Agree with Plan of Care  Patient       Patient will benefit from skilled therapeutic intervention in order to improve the following deficits and impairments:  Pain, Increased muscle spasms, Decreased range of motion, Hypomobility  Visit Diagnosis: Cervicalgia  Acute bilateral low back pain without sciatica  Stiffness of cervical spine  Other muscle spasm     Problem List Patient Active Problem List   Diagnosis Date Noted  . Persistent cough for 3 weeks or longer 01/03/2018  . Acute cervical sprain 01/03/2018  . Low back pain  radiating to left leg 02/06/2017  . Drug-induced erectile dysfunction 10/13/2016  . Morbid obesity (HCC) 06/28/2016  . Male hypogonadism 06/28/2016  . Abnormal weight gain 06/28/2016  . Essential hypertension, benign 06/05/2016  . Elevated hemoglobin (HCC) 06/05/2016  . Low testosterone 05/19/2016  . Elevated ALT measurement 05/19/2016  . Vitamin D deficiency 05/19/2016  . Lumbar disc herniation 05/05/2016  . Morbid obesity due to excess calories (HCC) 05/05/2016  . Other fatigue 05/05/2016    Roderic Scarce PT  01/18/2018, 11:50 AM  [facility] [address] [place], [place], [address] Phone: [phone]   Fax:  [phone]  Name: Dale Howard MRN: [id number] Date of Birth: 05-15-1991

## 2018-01-18 NOTE — Progress Notes (Signed)
   Subjective:    Patient ID: Dale Howard, male    DOB: [birth year], 27 y.o.   MRN: [id number]  HPI  Dale Howard is here for blood pressure check. He did restart the Lisinopril 40 mg once daily. Denies chest pain, shortness of breath and headaches.   Review of Systems     Objective:   Physical Exam  Vitals:   01/18/18 1155  BP: (!) 145/81  Pulse: 82  SpO2: 100%         Assessment & Plan:  Hypertension - Added amlodipine 10 mg once daily, per Gena Frayharley Cummings, PA-C. Patient advised to follow a DASH diet and follow up with Jade in 3 weeks.

## 2018-01-23 ENCOUNTER — Encounter: Payer: Self-pay | Admitting: Physical Therapy

## 2018-01-23 ENCOUNTER — Ambulatory Visit (INDEPENDENT_AMBULATORY_CARE_PROVIDER_SITE_OTHER): Payer: BLUE CROSS/BLUE SHIELD | Admitting: Physical Therapy

## 2018-01-23 DIAGNOSIS — M545 Low back pain, unspecified: Secondary | ICD-10-CM

## 2018-01-23 DIAGNOSIS — M62838 Other muscle spasm: Secondary | ICD-10-CM

## 2018-01-23 NOTE — Therapy (Signed)
[place] [place], [place], [address] Phone: [phone]   Fax:  [phone]  Physical Therapy Treatment  Patient Details  Name: Dale Howard MRN: [id number] Date of Birth: 02-05-1991 Referring Provider: Sherlie Ban, PA   Encounter Date: 01/23/2018  PT End of Session - 01/23/18 1608    Visit Number  4    Number of Visits  8    Date for PT Re-Evaluation  02/06/18    PT Start Time  6767    PT Stop Time  1655    PT Time Calculation (min)  54 min    Activity Tolerance  Patient tolerated treatment well;No increased pain    Behavior During Therapy  [facility] for tasks assessed/performed       History reviewed. No pertinent past medical history.  History reviewed. No pertinent surgical history.  There were no vitals filed for this visit.  Subjective Assessment - 01/23/18 1609    Subjective  Neck has been much better.  His back feels tight in morning, relieved with stretches.  Low back and Lt hip have been bothersome. Gets worse as day goes on.     Currently in Pain?  Yes    Pain Score  4     Pain Location  Back    Pain Orientation  Lower;Left    Pain Descriptors / Indicators  Tightness    Aggravating Factors   prolonged sitting    Pain Relieving Factors  standing, stretches.          [facility] PT Assessment - 01/23/18 0001      Assessment   Medical Diagnosis  cervical and lumbar pain    Referring Provider  Sherlie Ban, PA    Onset Date/Surgical Date  01/02/18    Hand Dominance  Left    Next MD Visit  PRN      AROM   Cervical - Right Rotation  73    Cervical - Left Rotation  85      Palpation   Spinal mobility  good mobiity in lumbar spine in L1-3, Hypomobile in L4-5.  Good sacral mobility     Palpation comment  Lt ilium higher in standing, Lt leg appears shorter and ASIS higher than Rt in supine, tender in Lt ant hip        [facility] Adult PT Treatment/Exercise - 01/23/18 0001      Self-Care   Other Self-Care Comments   Pt educated on self massage to posterior/ant Lt hip to decrease tightness and pain.  Pt verbalized understanding and returned demo.       Lumbar Exercises: Stretches   Passive Hamstring Stretch  Left;Right;2 reps;30 seconds    Quad Stretch  Left;Right;1 rep;20 seconds    Piriformis Stretch  Right;Left;2 reps;30 seconds      Lumbar Exercises: Aerobic   Nustep  L5: arms/legs x 5.5 min  PTA present to discuss progress       Shoulder Exercises: Stretch   Other Shoulder Stretches  3 position doorway stretch x 30 sec each       Moist Heat Therapy   Number Minutes Moist Heat  15 Minutes    Moist Heat Location  Lumbar Spine      Electrical Stimulation   Electrical Stimulation Location  lumbar paraspinals and Lt hip    Electrical Stimulation Action  IFC    Electrical Stimulation Parameters  to tolerance     Electrical Stimulation Goals  Pain  Manual Therapy   Manual Therapy  Muscle Energy Technique    Joint Mobilization  mobs grade III++ CPA lumbar,  L3 and L4 articular pillar (with tenderness/pain); L3 and 4 overpressure with active press up x 5 reps    Muscle Energy Technique  MET correction for elevated Lt ilium in Lt sidelying with contract relax of QL.                  PT Long Term Goals - 01/23/18 1652      PT LONG TERM GOAL #1   Title  I with advanced HEP ( 02/06/18)     Time  4    Period  Weeks    Status  On-going      PT LONG TERM GOAL #2   Title  demo Rt cervical rotation =/> 70 degress ( 02/06/18)     Time  4    Period  Weeks    Status  Achieved      PT LONG TERM GOAL #3   Title  report overall reduction of pain in back to his baseline level of function ( 02/06/18)     Time  4    Period  Weeks    Status  On-going      PT LONG TERM GOAL #4   Title  improve FOTO =/< 21% limited ( 02/06/18)     Time  4    Period  Weeks    Status  On-going            Plan - 01/23/18 1645    Clinical Impression Statement   Pt had some pelvis asymmetries; improved with spinal/sacral mobs (provided by supervising PT, Jeral Pinch), and MET correction. Pt reported significant reduction in pain after manual therapy and further reduction with estim / MHP at end of session.      Rehab Potential  Good    PT Frequency  2x / week    PT Duration  4 weeks    PT Treatment/Interventions  Neuromuscular re-education;Dry needling;Manual techniques;Moist Heat;Traction;Ultrasound;Therapeutic activities;Patient/family education;Taping;Therapeutic exercise;Cryotherapy;Electrical Stimulation;Passive range of motion    PT Next Visit Plan  Assess pelvis/ lumbar alignment.  Manual therapy as indicated.  HEP for lumbar/pelvis stabilization.     Consulted and Agree with Plan of Care  Patient       Patient will benefit from skilled therapeutic intervention in order to improve the following deficits and impairments:  Pain, Increased muscle spasms, Decreased range of motion, Hypomobility  Visit Diagnosis: Acute bilateral low back pain without sciatica  Other muscle spasm     Problem List Patient Active Problem List   Diagnosis Date Noted  . Persistent cough for 3 weeks or longer 01/03/2018  . Acute cervical sprain 01/03/2018  . Low back pain radiating to left leg 02/06/2017  . Drug-induced erectile dysfunction 10/13/2016  . Morbid obesity ([place]) 06/28/2016  . Male hypogonadism 06/28/2016  . Abnormal weight gain 06/28/2016  . Essential hypertension, benign 06/05/2016  . Elevated hemoglobin ([place]) 06/05/2016  . Low testosterone 05/19/2016  . Elevated ALT measurement 05/19/2016  . Vitamin D deficiency 05/19/2016  . Lumbar disc herniation 05/05/2016  . Morbid obesity due to excess calories ([place]) 05/05/2016  . Other fatigue 05/05/2016   Kerin Perna, PTA 01/23/18 4:55 PM   Jeral Pinch PT 01/23/2018, 16:34   [facility] [address] [place], [place],  [address] Phone: [phone]   Fax:  [phone]  Name: Dale Howard MRN: [id number] Date  of Birth: 02-06-1991

## 2018-01-25 ENCOUNTER — Ambulatory Visit (INDEPENDENT_AMBULATORY_CARE_PROVIDER_SITE_OTHER): Payer: BLUE CROSS/BLUE SHIELD | Admitting: Physical Therapy

## 2018-01-25 DIAGNOSIS — M436 Torticollis: Secondary | ICD-10-CM | POA: Diagnosis not present

## 2018-01-25 DIAGNOSIS — M545 Low back pain, unspecified: Secondary | ICD-10-CM

## 2018-01-25 DIAGNOSIS — M62838 Other muscle spasm: Secondary | ICD-10-CM | POA: Diagnosis not present

## 2018-01-25 DIAGNOSIS — M542 Cervicalgia: Secondary | ICD-10-CM

## 2018-01-25 NOTE — Patient Instructions (Signed)
  Abdominal Bracing With Pelvic Floor (Hook-Lying)   With neutral spine, tighten pelvic floor and abdominals. Hold 10 seconds. Repeat __10_ times. Do _several__ times a day. Can be performed in sitting, standing. .  Knee to Chest: Transverse Plane Stability   Bring one knee up, then return. Be sure pelvis does not roll side to side. Keep pelvis still. Lift knee __10_ times each leg. Restabilize pelvis. Repeat with other leg. Do _1-2__ sets, _1-2__ times per day.  Hip External Rotation With Pillow: Transverse Plane Stability   KEEP BOTH KNEES BENT.  Slowly roll bent knee out. Be sure pelvis does not rotate. Do _10__ times. Restabilize pelvis. Repeat with other leg. Do _1-2__ sets, _1__ times per day.    [facility] [address] [place], [place] [address]  [phone] (office) [phone] (fax)   

## 2018-01-25 NOTE — Therapy (Signed)
[place], [place], [address] Phone: [phone]   Fax:  [phone]  Physical Therapy Treatment  Patient Details  Name: Dale Howard MRN: [id number] Date of Birth: Dec 21, 1990 Referring Provider: Sherlie Ban, PA   Encounter Date: 01/25/2018  PT End of Session - 01/25/18 1014    Visit Number  5    Number of Visits  8    Date for PT Re-Evaluation  02/06/18    PT Start Time  0930    PT Stop Time  1025    PT Time Calculation (min)  55 min    Activity Tolerance  Patient tolerated treatment well    Behavior During Therapy  [facility] for tasks assessed/performed       No past medical history on file.  No past surgical history on file.  There were no vitals filed for this visit.  Subjective Assessment - 01/25/18 0944    Subjective  Pt reports his neck feels resolved.  He complains on returned Lt glute tightness and discomfort.  He has some back pain with bending over and reaching.      Patient Stated Goals  get rid of pain    Currently in Pain?  Yes    Pain Score  1     Pain Location  Buttocks    Pain Orientation  Left    Pain Descriptors / Indicators  Tightness;Aching    Aggravating Factors   prolonged sitting    Pain Relieving Factors  standing, stretches         [facility] PT Assessment - 01/25/18 0001      Assessment   Medical Diagnosis  cervical and lumbar pain    Referring Provider  Sherlie Ban, PA    Onset Date/Surgical Date  01/02/18    Hand Dominance  Left    Next MD Visit  PRN      Palpation   Palpation comment  tender and tight in the Lt QL and upper gluts.         [facility] PT Treatment/Exercise - 01/25/18 0001      Lumbar Exercises: Stretches   Passive Hamstring Stretch  Left;Right;2 reps;30 seconds    Piriformis Stretch  Right;Left;2 reps;30 seconds      Lumbar Exercises: Aerobic   Nustep  L5: arms/legs x 5.5 min  PTA present to discuss progress       Lumbar  Exercises: Standing   Other Standing Lumbar Exercises  squats with dowel against back and mirror for feedback of posture x 10 reps (no increase in pain)       Lumbar Exercises: Supine   Ab Set  10 reps;5 seconds    Clam  10 reps with core engaged.     Bent Knee Raise  10 reps alternating, with core engaged.     Bridge  10 reps;3 seconds      Shoulder Exercises: Stretch   Other Shoulder Stretches  3 position doorway stretch x 30 sec each       Moist Heat Therapy   Number Minutes Moist Heat  15 Minutes    Moist Heat Location  Lumbar Spine      Electrical Stimulation   Electrical Stimulation Location  lumbar paraspinals and Lt hip    Electrical Stimulation Action  IFC    Electrical Stimulation Parameters   to tolerance    Electrical Stimulation Goals  Pain      Manual Therapy   Manual therapy comments  overpressure to sacrum and lower lumbar while pt did press up x 3 reps    Soft tissue mobilization  STM to Lt QL, lumbar paraspinals and upper gluts    Muscle Energy Technique  MET correction for Rt sacral torsion (in prone)             PT Education - 01/25/18 1015    Education provided  Yes    Education Details  HEP     Person(s) Educated  Patient    Methods  Explanation;Demonstration;Verbal cues;Handout    Comprehension  Verbalized understanding;Returned demonstration          PT Long Term Goals - 01/23/18 1652      PT LONG TERM GOAL #1   Title  I with advanced HEP ( 02/06/18)     Time  4    Period  Weeks    Status  On-going      PT LONG TERM GOAL #2   Title  demo Rt cervical rotation =/> 70 degress ( 02/06/18)     Time  4    Period  Weeks    Status  Achieved      PT LONG TERM GOAL #3   Title  report overall reduction of pain in back to his baseline level of function ( 02/06/18)     Time  4    Period  Weeks    Status  On-going      PT LONG TERM GOAL #4   Title  improve FOTO =/< 21% limited ( 02/06/18)     Time  4    Period  Weeks    Status  On-going             Plan - 01/25/18 1258    Clinical Impression Statement  Pt educated in transverse abdominal exercises and utilizing those muscles during functional tasks at work.  He reported reduction in back discomfort with squats when engaging core.  He tolerated all exercises well.  Pt making good progress towards remaining goals.     Rehab Potential  Good    PT Frequency  2x / week    PT Duration  4 weeks    PT Treatment/Interventions  Neuromuscular re-education;Dry needling;Manual techniques;Moist Heat;Traction;Ultrasound;Therapeutic activities;Patient/family education;Taping;Therapeutic exercise;Cryotherapy;Electrical Stimulation;Passive range of motion    PT Next Visit Plan  Assess pelvis/ lumbar alignment.  Manual therapy as indicated.  HEP for lumbar/pelvis stabilization.     Consulted and Agree with Plan of Care  Patient       Patient will benefit from skilled therapeutic intervention in order to improve the following deficits and impairments:  Pain, Increased muscle spasms, Decreased range of motion, Hypomobility  Visit Diagnosis: Acute bilateral low back pain without sciatica  Other muscle spasm  Cervicalgia  Stiffness of cervical spine     Problem List Patient Active Problem List   Diagnosis Date Noted  . Persistent cough for 3 weeks or longer 01/03/2018  . Acute cervical sprain 01/03/2018  . Low back pain radiating to left leg 02/06/2017  . Drug-induced erectile dysfunction 10/13/2016  . Morbid obesity ([place]) 06/28/2016  . Male hypogonadism 06/28/2016  . Abnormal weight gain 06/28/2016  . Essential hypertension, benign 06/05/2016  . Elevated hemoglobin ([place]) 06/05/2016  . Low testosterone 05/19/2016  . Elevated ALT measurement 05/19/2016  . Vitamin D deficiency 05/19/2016  . Lumbar disc herniation 05/05/2016  . Morbid obesity due to excess calories ([place]) 05/05/2016  . Other fatigue 05/05/2016   Kerin Perna, PTA 01/25/18 1:05  [place], [place], [address] Phone: [phone]   Fax:  [phone]  Name: Dale Howard MRN: [id number] Date of Birth: 03/28/1991

## 2018-01-28 ENCOUNTER — Ambulatory Visit (INDEPENDENT_AMBULATORY_CARE_PROVIDER_SITE_OTHER): Payer: BLUE CROSS/BLUE SHIELD | Admitting: Physical Therapy

## 2018-01-28 DIAGNOSIS — M545 Low back pain, unspecified: Secondary | ICD-10-CM

## 2018-01-28 DIAGNOSIS — M62838 Other muscle spasm: Secondary | ICD-10-CM | POA: Diagnosis not present

## 2018-01-28 DIAGNOSIS — M542 Cervicalgia: Secondary | ICD-10-CM

## 2018-01-28 NOTE — Therapy (Signed)
[facility] [address] [place], [place], [address] Phone: [phone]   Fax:  [phone]  Physical Therapy Treatment  Patient Details  Name: Dale Howard MRN: [id number] Date of Birth: June 26, 1991 Referring Provider: Rosita Keaharlie Cummings, PA   Encounter Date: 01/28/2018  PT End of Session - 01/28/18 1408    Visit Number  6    Number of Visits  8    Date for PT Re-Evaluation  02/06/18    PT Start Time  1402    PT Stop Time  1450    PT Time Calculation (min)  48 min    Activity Tolerance  Patient tolerated treatment well    Behavior During Therapy  [facility] for tasks assessed/performed       No past medical history on file.  No past surgical history on file.  There were no vitals filed for this visit.  Subjective Assessment - 01/28/18 1412    Subjective  Pt reports his LBP upon waking was 5/10; slowly resolved with stretches.  He has been trying to be more aware of keeping a straight back with lifting at work; this has helped reduce back pain.     Patient Stated Goals  get rid of pain    Currently in Pain?  No/denies    Pain Score  0-No pain         [facility] PT Assessment - 01/28/18 0001      Assessment   Medical Diagnosis  cervical and lumbar pain    Referring Provider  Rosita Keaharlie Cummings, PA    [place] Date/Surgical Date  01/02/18    Hand Dominance  Left    Next MD Visit  PRN      Palpation   Palpation comment  Lt ilium slightly higher in standing, tender in Lt ant hip       [facility] Adult PT Treatment/Exercise - 01/28/18 0001      Lumbar Exercises: Stretches   Passive Hamstring Stretch  Left;Right;2 reps;30 seconds    Piriformis Stretch  Right;Left;2 reps;30 seconds      Lumbar Exercises: Supine   Clam  10 reps with core engaged.     Heel Slides  10 reps    Bent Knee Raise  10 reps alternating, with core engaged. 2 sets    Bridge  10 reps;5 seconds      Lumbar Exercises: Sidelying   Clam  Right;Left;15 reps       Lumbar Exercises: Prone   Other Prone Lumbar Exercises  10 reps each side with axial ext      Moist Heat Therapy   Number Minutes Moist Heat  15 Minutes    Moist Heat Location  Lumbar Spine      Electrical Stimulation   Electrical Stimulation Location  lumbar paraspinals and Lt hip    Electrical Stimulation Action  IFC    Electrical Stimulation Parameters  to tolerance    Electrical Stimulation Goals  Pain             PT Education - 01/28/18 1711    Education provided  Yes    Education Details  HEP - added opp arm/leg and clams    Person(s) Educated  Patient    Methods  Explanation          PT Long Term Goals - 01/28/18 1416      PT LONG TERM GOAL #1   Title  I with advanced HEP ( 02/06/18)     Time  4  Period  Weeks    Status  On-going      PT LONG TERM GOAL #2   Title  demo Rt cervical rotation =/> 70 degress ( 02/06/18)     Time  4    Period  Weeks    Status  Achieved      PT LONG TERM GOAL #3   Title  report overall reduction of pain in back to his baseline level of function ( 02/06/18)     Time  4    Period  Weeks    Status  On-going      PT LONG TERM GOAL #4   Title  improve FOTO =/< 21% limited ( 02/06/18)     Time  4    Period  Weeks    Status  On-going            Plan - 01/28/18 1420    Clinical Impression Statement  Pt reported less fatigue with abdominal exercises today, vs last visit 3 days ago.  He tolerated new exercises without increase in pain. He is reporting decreased pain level during day with use of strategies given to him in therapy; limited by pain in AM upon waking.  Progressing well towards remaining goals.     Rehab Potential  Good    PT Frequency  2x / week    PT Duration  4 weeks    PT Treatment/Interventions  Neuromuscular re-education;Dry needling;Manual techniques;Moist Heat;Traction;Ultrasound;Therapeutic activities;Patient/family education;Taping;Therapeutic exercise;Cryotherapy;Electrical Stimulation;Passive  range of motion    PT Next Visit Plan  progress HEP    Consulted and Agree with Plan of Care  Patient       Patient will benefit from skilled therapeutic intervention in order to improve the following deficits and impairments:  Pain, Increased muscle spasms, Decreased range of motion, Hypomobility  Visit Diagnosis: Acute bilateral low back pain without sciatica  Other muscle spasm  Cervicalgia     Problem List Patient Active Problem List   Diagnosis Date Noted  . Persistent cough for 3 weeks or longer 01/03/2018  . Acute cervical sprain 01/03/2018  . Low back pain radiating to left leg 02/06/2017  . Drug-induced erectile dysfunction 10/13/2016  . Morbid obesity (HCC) 06/28/2016  . Male hypogonadism 06/28/2016  . Abnormal weight gain 06/28/2016  . Essential hypertension, benign 06/05/2016  . Elevated hemoglobin (HCC) 06/05/2016  . Low testosterone 05/19/2016  . Elevated ALT measurement 05/19/2016  . Vitamin D deficiency 05/19/2016  . Lumbar disc herniation 05/05/2016  . Morbid obesity due to excess calories (HCC) 05/05/2016  . Other fatigue 05/05/2016  Mayer Camel, PTA 01/28/18 5:13 PM  [facility] [place] [address] [place], [place], [address] Phone: [phone]   Fax:  [phone]  Name: Dale Howard MRN: [id number] Date of Birth: 09-30-1991

## 2018-01-30 ENCOUNTER — Encounter: Payer: BLUE CROSS/BLUE SHIELD | Admitting: Physical Therapy

## 2018-01-31 ENCOUNTER — Other Ambulatory Visit: Payer: Self-pay | Admitting: Physician Assistant

## 2018-01-31 DIAGNOSIS — S139XXA Sprain of joints and ligaments of unspecified parts of neck, initial encounter: Secondary | ICD-10-CM

## 2018-02-01 ENCOUNTER — Encounter: Payer: BLUE CROSS/BLUE SHIELD | Admitting: Physical Therapy

## 2018-02-06 ENCOUNTER — Encounter: Payer: BLUE CROSS/BLUE SHIELD | Admitting: Physical Therapy

## 2018-02-07 ENCOUNTER — Ambulatory Visit (INDEPENDENT_AMBULATORY_CARE_PROVIDER_SITE_OTHER): Payer: BLUE CROSS/BLUE SHIELD | Admitting: Physical Therapy

## 2018-02-07 DIAGNOSIS — M436 Torticollis: Secondary | ICD-10-CM | POA: Diagnosis not present

## 2018-02-07 DIAGNOSIS — M545 Low back pain, unspecified: Secondary | ICD-10-CM

## 2018-02-07 DIAGNOSIS — M542 Cervicalgia: Secondary | ICD-10-CM

## 2018-02-07 DIAGNOSIS — M62838 Other muscle spasm: Secondary | ICD-10-CM | POA: Diagnosis not present

## 2018-02-07 NOTE — Therapy (Addendum)
[place], [place], [address] Phone: [phone]   Fax:  [phone]  Physical Therapy Treatment  Patient Details  Name: Dale Howard MRN: [id number] Date of Birth: 09-01-1991 Referring Provider: Sherlie Ban, PA-C   Encounter Date: 02/07/2018  PT End of Session - 02/07/18 1707    Visit Number  7    Number of Visits  8    Date for PT Re-Evaluation  02/06/18    PT Start Time  1700    PT Stop Time  1740    PT Time Calculation (min)  40 min    Activity Tolerance  Patient tolerated treatment well    Behavior During Therapy  [facility] for tasks assessed/performed       No past medical history on file.  No past surgical history on file.  There were no vitals filed for this visit.  Subjective Assessment - 02/07/18 1708    Subjective  Pt reports he has pain and stiffness up to 5/10 in mornings, but with stretches and walking around, pain subsides. He has continued to engage core with lifting at work.  "I sit up a lot taller now at work/ in car". He reports his core doesn't fatigue as easy.     Patient Stated Goals  get rid of pain    Currently in Pain?  No/denies         [facility] PT Assessment - 02/07/18 0001      Assessment   Medical Diagnosis  cervical and lumbar pain    Referring Provider  Sherlie Ban, PA-C    Onset Date/Surgical Date  01/02/18    Hand Dominance  Left    Next MD Visit  PRN        [facility] Adult PT Treatment/Exercise - 02/07/18 0001      Lumbar Exercises: Stretches   Passive Hamstring Stretch  Left;Right;30 seconds;3 reps    Sports administrator  Right;Left;2 reps;30 seconds    Piriformis Stretch  Right;Left;2 reps;30 seconds      Lumbar Exercises: Aerobic   Nustep  L5: arms/legs x 8 min       Lumbar Exercises: Supine   Bent Knee Raise  10 reps    Dead Bug  10 reps    Other Supine Lumbar Exercises  Pilates hundred (25 pulses x 2 reps) with legs in hooklying.   Pilates opp  knee tuck with arms x 2 reps of 2 tucks.  Pilates heel taps x 10 with demo and VC.       Lumbar Exercises: Sidelying   Clam  Right;Left;15 reps      Lumbar Exercises: Prone   Other Prone Lumbar Exercises  10 reps each side with axial ext      verbally reviewed existing HEP; pt verbalized understanding.        PT Education - 02/07/18 1737    Education provided  Yes    Education Details  HEP     Person(s) Educated  Patient    Methods  Explanation;Handout    Comprehension  Verbalized understanding          PT Long Term Goals - 02/07/18 1716      PT LONG TERM GOAL #1   Title  I with advanced HEP ( 02/06/18)     Time  4    Period  Weeks    Status  Achieved      PT LONG TERM GOAL #2   Title  demo Rt cervical  rotation =/> 70 degress ( 02/06/18)     Time  4    Period  Weeks    Status  Achieved      PT LONG TERM GOAL #3   Title  report overall reduction of pain in back to his baseline level of function ( 02/06/18)     Time  4    Period  Weeks    Status  Achieved      PT LONG TERM GOAL #4   Title  improve FOTO =/< 21% limited ( 02/06/18)     Time  4    Period  Weeks    Status  Achieved - scored 4% limited.            Plan - 02/07/18 1747    Clinical Impression Statement  Pt tolerated all exercises well; add new core exercises to his existing HEP.  Pt has met all goals and verbalized readiness to d/c to HEP.     Rehab Potential  Good    PT Frequency  2x / week    PT Duration  4 weeks    PT Treatment/Interventions  Neuromuscular re-education;Dry needling;Manual techniques;Moist Heat;Traction;Ultrasound;Therapeutic activities;Patient/family education;Taping;Therapeutic exercise;Cryotherapy;Electrical Stimulation;Passive range of motion    PT Next Visit Plan  spoke to supervising PT; will d/c to HEP.     Consulted and Agree with Plan of Care  Patient       Patient will benefit from skilled therapeutic intervention in order to improve the following deficits and  impairments:  Pain, Increased muscle spasms, Decreased range of motion, Hypomobility  Visit Diagnosis: Acute bilateral low back pain without sciatica  Other muscle spasm  Cervicalgia  Stiffness of cervical spine     Problem List Patient Active Problem List   Diagnosis Date Noted  . Persistent cough for 3 weeks or longer 01/03/2018  . Acute cervical sprain 01/03/2018  . Low back pain radiating to left leg 02/06/2017  . Drug-induced erectile dysfunction 10/13/2016  . Morbid obesity ([place]) 06/28/2016  . Male hypogonadism 06/28/2016  . Abnormal weight gain 06/28/2016  . Essential hypertension, benign 06/05/2016  . Elevated hemoglobin ([place]) 06/05/2016  . Low testosterone 05/19/2016  . Elevated ALT measurement 05/19/2016  . Vitamin D deficiency 05/19/2016  . Lumbar disc herniation 05/05/2016  . Morbid obesity due to excess calories ([place]) 05/05/2016  . Other fatigue 05/05/2016   Kerin Perna, PTA 02/07/18 5:50 PM  [facility] [place], [place], [address] Phone: [phone]   Fax:  [phone]  Name: Dale Howard MRN: [id number] Date of Birth: [birth year]   PHYSICAL THERAPY DISCHARGE SUMMARY  Visits from Start of Care: 7  Current functional level related to goals / functional outcomes: See above   Remaining deficits: None, he is at his baseline level of function   Education / Equipment: HEP Plan: Patient agrees to discharge.  Patient goals were met. Patient is being discharged due to meeting the stated rehab goals.  ?????    Jeral Pinch, PT 02/21/18 12:30 PM

## 2018-02-07 NOTE — Patient Instructions (Addendum)
  [place]: Beginner    Start with feet flat. Lift head and hold. Pump hands slightly up and down. Breathe in for count of __25_. Breathe out for count of _25__.  Single Leg Stretch    Lie on back, opposite hand holding knee to chest, other hand on same shin, other leg at 45. Exhale, curling up head and upper torso. Holding curl, inhale and change leg and hand positions. Exhale, changing back. Repeat _2___ changes with single breaths. Repeat __2__ changes in double time: 2 per inhale, 2 per exhale. NOTE: Keep navel to spine, back flat.  Knee Fold    Lie on back, legs bent, arms by sides. Exhale, lifting knee to chest. Inhale, returning. Keep abdominals flat, navel to spine. Repeat __10__ times, alternating legs. Do __1__ sessions per day.

## 2018-02-08 ENCOUNTER — Ambulatory Visit (INDEPENDENT_AMBULATORY_CARE_PROVIDER_SITE_OTHER): Payer: BLUE CROSS/BLUE SHIELD | Admitting: Physician Assistant

## 2018-02-08 ENCOUNTER — Encounter: Payer: BLUE CROSS/BLUE SHIELD | Admitting: Physical Therapy

## 2018-02-08 ENCOUNTER — Encounter: Payer: Self-pay | Admitting: Physician Assistant

## 2018-02-08 VITALS — BP 151/87 | HR 79 | Ht 71.0 in | Wt 330.0 lb

## 2018-02-08 DIAGNOSIS — M5126 Other intervertebral disc displacement, lumbar region: Secondary | ICD-10-CM

## 2018-02-08 DIAGNOSIS — I10 Essential (primary) hypertension: Secondary | ICD-10-CM

## 2018-02-08 DIAGNOSIS — Z13220 Encounter for screening for lipoid disorders: Secondary | ICD-10-CM | POA: Diagnosis not present

## 2018-02-08 DIAGNOSIS — E291 Testicular hypofunction: Secondary | ICD-10-CM

## 2018-02-08 DIAGNOSIS — D582 Other hemoglobinopathies: Secondary | ICD-10-CM

## 2018-02-08 MED ORDER — AMLODIPINE BESYLATE 10 MG PO TABS: 10.0000 mg | ORAL_TABLET | Freq: Every day | ORAL | 3 refills | Status: DC

## 2018-02-08 MED ORDER — LORCASERIN HCL ER 20 MG PO TB24: 1.0000 | ORAL_TABLET | Freq: Every day | ORAL | 0 refills | Status: DC
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
## 2018-02-11 ENCOUNTER — Telehealth: Payer: Self-pay | Admitting: Physician Assistant

## 2018-02-11 NOTE — Telephone Encounter (Signed)
Recieved fax from [employer] that Belviq required a PA. Awaiting determination.

## 2018-02-14 NOTE — Telephone Encounter (Signed)
[facility] Belviq was approved from 02/11/2018 until 08/09/2018. Form sent to scan.   Reference number: [id number].

## 2018-03-08 ENCOUNTER — Encounter: Payer: Self-pay | Admitting: Physician Assistant

## 2018-03-08 ENCOUNTER — Ambulatory Visit (INDEPENDENT_AMBULATORY_CARE_PROVIDER_SITE_OTHER): Payer: BLUE CROSS/BLUE SHIELD | Admitting: Physician Assistant

## 2018-03-08 VITALS — BP 138/86 | HR 87 | Ht 71.0 in | Wt 327.0 lb

## 2018-03-08 DIAGNOSIS — I10 Essential (primary) hypertension: Secondary | ICD-10-CM

## 2018-03-08 MED ORDER — LORCASERIN HCL ER 20 MG PO TB24: 1.0000 | ORAL_TABLET | Freq: Every day | ORAL | 1 refills | Status: DC

## 2018-03-08 NOTE — Progress Notes (Signed)
   Subjective:    Patient ID: Dale Howard, male    DOB: 24-Mar-1991, 27 y.o.   MRN: [id number]  HPI  Pt is a morbidly obese male who presents to the clinic for weight and HTN follow up.   He is taking norvasc and lisinopril. Took about 1 hour ago. No CP, palpitations, headaches, vision changes. Not checking BP's.   He has lost 3lbs on belviq but only started 2 weeks ago. He is walking about 120000 steps a day. He is not going to the gym. He does feel less cravings on belviq. He is also ordering 3 meals a day from [employer]. This is managing his calories and giving him plenty of protein. He feels really good.   .. Active Ambulatory Problems    Diagnosis Date Noted  . Lumbar disc herniation 05/05/2016  . Other fatigue 05/05/2016  . Low testosterone 05/19/2016  . Elevated ALT measurement 05/19/2016  . Vitamin D deficiency 05/19/2016  . Essential hypertension, benign 06/05/2016  . Elevated hemoglobin (HCC) 06/05/2016  . Morbid obesity (HCC) 06/28/2016  . Male hypogonadism 06/28/2016  . Abnormal weight gain 06/28/2016  . Drug-induced erectile dysfunction 10/13/2016  . Low back pain radiating to left leg 02/06/2017  . Persistent cough for 3 weeks or longer 01/03/2018  . Acute cervical sprain 01/03/2018   Resolved Ambulatory Problems    Diagnosis Date Noted  . Morbid obesity due to excess calories (HCC) 05/05/2016   No Additional Past Medical History    Review of Systems  All other systems reviewed and are negative.      Objective:   Physical Exam  Constitutional: He is oriented to person, place, and time. He appears well-developed and well-nourished.  Obese.   HENT:  Head: Normocephalic and atraumatic.  Cardiovascular: Normal rate, regular rhythm and normal heart sounds.  Pulmonary/Chest: Effort normal and breath sounds normal.  Neurological: He is alert and oriented to person, place, and time.  Psychiatric: He has a normal mood and affect. His behavior is normal.           Assessment & Plan:  Marland Kitchen.Marland Kitchen.Dale Howard was seen today for hypertension and obesity.  Diagnoses and all orders for this visit:  Essential hypertension, benign  Morbid obesity (HCC) -     Lorcaserin HCl ER (BELVIQ XR) 20 MG TB24; Take 1 tablet by mouth daily.   Continue on same BP medications. BP improving.   Continue on belviq. Continue on diet and exercise(walking) discussed to increase exercise as tolerated. Discussed goal of 5 percent of weight loss in 3 months of belviq.  Marland Kitchen..Discussed low carb diet with 1500 calories and 80g of protein.  Exercising at least 150 minutes a week.  My Fitness Pal could be a Chief Technology Officergreat resource.    Follow up in 3 months.

## 2018-03-09 LAB — CBC WITH DIFFERENTIAL/PLATELET
BASOS PCT: 0.3 %
Basophils Absolute: 23 cells/uL (ref 0–200)
EOS ABS: 312 {cells}/uL (ref 15–500)
Eosinophils Relative: 4 %
HCT: 47.1 % (ref 38.5–50.0)
HEMOGLOBIN: 16.5 g/dL (ref 13.2–17.1)
Lymphs Abs: 2215 cells/uL (ref 850–3900)
MCH: 29.6 pg (ref 27.0–33.0)
MCHC: 35 g/dL (ref 32.0–36.0)
MCV: 84.4 fL (ref 80.0–100.0)
MONOS PCT: 9.4 %
MPV: 11.4 fL (ref 7.5–12.5)
NEUTROS ABS: 4516 {cells}/uL (ref 1500–7800)
Neutrophils Relative %: 57.9 %
Platelets: 201 10*3/uL (ref 140–400)
RBC: 5.58 10*6/uL (ref 4.20–5.80)
RDW: 13.1 % (ref 11.0–15.0)
Total Lymphocyte: 28.4 %
WBC: 7.8 10*3/uL (ref 3.8–10.8)
WBCMIX: 733 {cells}/uL (ref 200–950)

## 2018-03-09 LAB — COMPLETE METABOLIC PANEL WITH GFR
AG Ratio: 1.4 (calc) (ref 1.0–2.5)
ALKALINE PHOSPHATASE (APISO): 92 U/L (ref 40–115)
ALT: 39 U/L (ref 9–46)
AST: 27 U/L (ref 10–40)
Albumin: 4.3 g/dL (ref 3.6–5.1)
BUN: 19 mg/dL (ref 7–25)
CALCIUM: 9.5 mg/dL (ref 8.6–10.3)
CO2: 29 mmol/L (ref 20–32)
CREATININE: 0.86 mg/dL (ref 0.60–1.35)
Chloride: 105 mmol/L (ref 98–110)
GFR, EST NON AFRICAN AMERICAN: 119 mL/min/{1.73_m2} (ref >=60)
GFR, Est African American: 138 mL/min/{1.73_m2} (ref >=60)
Globulin: 3 g/dL (calc) (ref 1.9–3.7)
Glucose, Bld: 94 mg/dL (ref 65–99)
POTASSIUM: 4.3 mmol/L (ref 3.5–5.3)
Sodium: 140 mmol/L (ref 135–146)
Total Bilirubin: 0.5 mg/dL (ref 0.2–1.2)
Total Protein: 7.3 g/dL (ref 6.1–8.1)

## 2018-03-09 LAB — LIPID PANEL W/REFLEX DIRECT LDL
CHOL/HDL RATIO: 4.1 (calc) (ref <5.0)
Cholesterol: 168 mg/dL (ref <200)
HDL: 41 mg/dL (ref >40)
LDL Cholesterol (Calc): 110 mg/dL (calc) — ABNORMAL HIGH
NON-HDL CHOLESTEROL (CALC): 127 mg/dL (ref <130)
Triglycerides: 83 mg/dL (ref <150)

## 2018-03-09 LAB — TSH: TSH: 1.18 m[IU]/L (ref 0.40–4.50)

## 2018-03-09 LAB — TESTOSTERONE: Testosterone: 197 ng/dL — ABNORMAL LOW (ref 250–827)

## 2018-03-12 ENCOUNTER — Other Ambulatory Visit: Payer: Self-pay | Admitting: Physician Assistant

## 2018-03-12 DIAGNOSIS — E291 Testicular hypofunction: Secondary | ICD-10-CM

## 2018-03-12 NOTE — Progress Notes (Signed)
Am

## 2018-03-12 NOTE — Progress Notes (Signed)
Call pt: cholesterol looks really good. Kidney, liver, glucose look great. Testosterone still low. Are you interested in testosterone supplementation? If you are need to see endocrinologist if wish to remain able to obtain fertility in the future.

## 2018-03-12 NOTE — Progress Notes (Signed)
Ok referral placed.

## 2018-04-19 ENCOUNTER — Ambulatory Visit (INDEPENDENT_AMBULATORY_CARE_PROVIDER_SITE_OTHER): Payer: BLUE CROSS/BLUE SHIELD | Admitting: Physician Assistant

## 2018-04-19 ENCOUNTER — Encounter: Payer: Self-pay | Admitting: Physician Assistant

## 2018-04-19 ENCOUNTER — Ambulatory Visit (INDEPENDENT_AMBULATORY_CARE_PROVIDER_SITE_OTHER): Payer: BLUE CROSS/BLUE SHIELD

## 2018-04-19 VITALS — BP 132/84 | HR 89 | Temp 97.9°F | Resp 14 | Wt 326.0 lb

## 2018-04-19 DIAGNOSIS — J209 Acute bronchitis, unspecified: Secondary | ICD-10-CM

## 2018-04-19 DIAGNOSIS — R05 Cough: Secondary | ICD-10-CM

## 2018-04-19 MED ORDER — FLUTICASONE PROPIONATE 50 MCG/ACT NA SUSP: 1.0000 | Freq: Every day | NASAL | 3 refills | Status: DC

## 2018-04-19 MED ORDER — PREDNISONE 20 MG PO TABS: 40.0000 mg | ORAL_TABLET | Freq: Every day | ORAL | 0 refills | Status: AC

## 2018-04-19 MED ORDER — HYDROCOD POLST-CPM POLST ER 10-8 MG/5ML PO SUER: 5.0000 mL | Freq: Every evening | ORAL | 0 refills | Status: AC | PRN

## 2018-04-19 NOTE — Patient Instructions (Signed)
-   continue Albuterol 1-2 puffs every 6 hours as needed for wheezing/shortness of breath.  If needing inhaler more often than this, follow-up with your PCP - start Prednisone daily for 5 days. This will also help with wheezing and shortness of breath - Tussionex at bedtime as needed for cough - Flonase 1 spray each nostril daily for nasal congestion. Continue saline rinses as needed. Use saline rinse prior to nasal spray

## 2018-04-19 NOTE — Progress Notes (Signed)
HPI:                                                                Dale Howard is a 27 y.o. male who presents to [facility] today for cough  Reports developing rhinorrhea, congestion, wheezing and dry cough 2 weeks ago. Diagnosed in urgent care on 04/13/18 with viral URI with bronchospasm. Treated with Tussionex and Albuterol. Reports medications helped a lot. Currently using Albuterol every 6 hours. Mainly complains of non-productive cough and dyspnea today. Denies fever, chills, chest pain, hemoptysis. Denies sinus pressure/pain. Nonsmoker. No history of reactive airway disease.  Depression screen [facility] 2/9 02/08/2018 02/20/2017  Decreased Interest 0 0  Down, Depressed, Hopeless 1 0  PHQ - 2 Score 1 0    No flowsheet data found.    History reviewed. No pertinent past medical history. History reviewed. No pertinent surgical history. Social History   Tobacco Use  . Smoking status: Former Games developer  . Smokeless tobacco: Never Used  Substance Use Topics  . Alcohol use: Yes    Alcohol/week: 0.0 oz   family history includes Heart attack in his maternal grandmother; Hyperlipidemia in his maternal grandmother; Hypertension in his maternal grandmother.    ROS: negative except as noted in the HPI  Medications: Current Outpatient Medications  Medication Sig Dispense Refill  . amLODipine (NORVASC) 10 MG tablet Take 1 tablet (10 mg total) by mouth daily. 30 tablet 3  . cyclobenzaprine (FLEXERIL) 10 MG tablet Take 1 tablet (10 mg total) by mouth 3 (three) times daily as needed for muscle spasms. 30 tablet 0  . lisinopril (PRINIVIL,ZESTRIL) 40 MG tablet Take 1 tablet (40 mg total) by mouth daily. 90 tablet 1  . Lorcaserin HCl ER (BELVIQ XR) 20 MG TB24 Take 1 tablet by mouth daily. 30 tablet 1  . meloxicam (MOBIC) 15 MG tablet TAKE 1 TABLET BY MOUTH EVERY DAY 30 tablet 0  . chlorpheniramine-HYDROcodone (TUSSIONEX) 10-8 MG/5ML SUER Take 5 mLs  by mouth at bedtime as needed for up to 5 days for cough. 25 mL 0  . fluticasone (FLONASE) 50 MCG/ACT nasal spray Place 1 spray into both nostrils daily. 16 g 3  . predniSONE (DELTASONE) 20 MG tablet Take 2 tablets (40 mg total) by mouth daily with breakfast for 5 days. 10 tablet 0   No current facility-administered medications for this visit.    Allergies  Allergen Reactions  . Penicillins Other (See Comments)    Unknown, childhood  . Phentermine     Erectile dysfunction       Objective:  BP 132/84   Pulse 89   Temp 97.9 F (36.6 C) (Oral)   Wt (!) 326 lb (147.9 kg)   SpO2 95%   BMI 45.47 kg/m  Gen:  alert, not ill-appearing, no distress, appropriate for age, obese male HEENT: head normocephalic without obvious abnormality, conjunctiva and cornea clear, TM's pearly gray and semi-transparent, oropharynx clear, moist mucous membranes, nasal mucosa edematous with clear rhinorrhea, neck supple, no cervical adenopathy, trachea midline Pulm: Normal work of breathing, normal phonation, clear to auscultation bilaterally, no wheezes, rales or rhonchi CV: Normal rate, regular rhythm, s1 and s2 distinct, no murmurs, clicks or rubs  Neuro: alert and oriented x 3, no  tremor MSK: extremities atraumatic, normal gait and station Skin: intact, no rashes on exposed skin, no jaundice, no cyanosis   No results found for this or any previous visit (from the past 72 hour(s)). No results found.    Assessment and Plan: 27 y.o. male with   Acute bronchitis with bronchospasm - Plan: DG Chest 2 View, chlorpheniramine-HYDROcodone (TUSSIONEX) 10-8 MG/5ML SUER, fluticasone (FLONASE) 50 MCG/ACT nasal spray, predniSONE (DELTASONE) 20 MG tablet  SpO2 95% on RA at rest. Given persistent symptoms for 2 weeks and relative hypoxia, will get Chest x-ray today to assess for infiltrate. Otherwise, he is clinically improving, afebrile, no tachycardia or tachypnea Steroid burst. Cont Albuterol 2 puffs every 6  hours as needed. Tussionex at bedtime as needed.   Patient education and anticipatory guidance given Patient agrees with treatment plan Follow-up as needed if symptoms worsen or fail to improve  Levonne Hubert PA-C

## 2018-04-19 NOTE — Progress Notes (Signed)
Negative chest x-ray Treatment plan does not change

## 2018-05-08 ENCOUNTER — Other Ambulatory Visit: Payer: Self-pay | Admitting: Physician Assistant

## 2018-05-08 DIAGNOSIS — I10 Essential (primary) hypertension: Secondary | ICD-10-CM

## 2018-06-07 ENCOUNTER — Ambulatory Visit: Payer: BLUE CROSS/BLUE SHIELD | Admitting: Physician Assistant

## 2018-09-06 ENCOUNTER — Ambulatory Visit (INDEPENDENT_AMBULATORY_CARE_PROVIDER_SITE_OTHER): Payer: BLUE CROSS/BLUE SHIELD | Admitting: Physician Assistant

## 2018-09-06 ENCOUNTER — Encounter: Payer: Self-pay | Admitting: Physician Assistant

## 2018-09-06 VITALS — BP 147/79 | HR 76 | Ht 71.0 in | Wt 331.0 lb

## 2018-09-06 DIAGNOSIS — I10 Essential (primary) hypertension: Secondary | ICD-10-CM | POA: Diagnosis not present

## 2018-09-06 DIAGNOSIS — Z23 Encounter for immunization: Secondary | ICD-10-CM

## 2018-09-06 DIAGNOSIS — Z0000 Encounter for general adult medical examination without abnormal findings: Secondary | ICD-10-CM

## 2018-09-06 MED ORDER — AMLODIPINE BESYLATE 10 MG PO TABS: 10.0000 mg | ORAL_TABLET | Freq: Every day | ORAL | 1 refills | Status: DC

## 2018-09-06 MED ORDER — LISINOPRIL 40 MG PO TABS: 40.0000 mg | ORAL_TABLET | Freq: Every day | ORAL | 1 refills | Status: DC

## 2018-09-06 MED ORDER — PHENTERMINE HCL 37.5 MG PO TABS: 37.5000 mg | ORAL_TABLET | Freq: Every day | ORAL | 0 refills | Status: DC

## 2018-09-06 MED ORDER — LORCASERIN HCL ER 20 MG PO TB24: 1.0000 | ORAL_TABLET | Freq: Every day | ORAL | 1 refills | Status: DC

## 2018-09-06 NOTE — Progress Notes (Signed)
Subjective:    Patient ID: Dale Howard, male    DOB: [birth year], 27 y.o.   MRN: [id number]  HPI  Pt is a 27 yo morbidly obese male who presents to the clinic for CPE.   He continues to struggle with weight. He was never called by bariatric and would like another referral. He did phentermine and had some ED however he think ED could have been from his stress and would like to try it again.   He feels better cutting out a lot of meat from his diet. He is staying active.   .. Active Ambulatory Problems    Diagnosis Date Noted  . Lumbar disc herniation 05/05/2016  . Other fatigue 05/05/2016  . Low testosterone 05/19/2016  . Elevated ALT measurement 05/19/2016  . Vitamin D deficiency 05/19/2016  . Essential hypertension, benign 06/05/2016  . Elevated hemoglobin (HCC) 06/05/2016  . Morbid obesity (HCC) 06/28/2016  . Male hypogonadism 06/28/2016  . Abnormal weight gain 06/28/2016  . Drug-induced erectile dysfunction 10/13/2016  . Low back pain radiating to left leg 02/06/2017  . Persistent cough for 3 weeks or longer 01/03/2018  . Acute cervical sprain 01/03/2018   Resolved Ambulatory Problems    Diagnosis Date Noted  . Morbid obesity due to excess calories (HCC) 05/05/2016   Past Medical History:  Diagnosis Date  . Hypertension   . Hypogonadism in male   . Obesity    .Marland Kitchen. Family History  Problem Relation Age of Onset  . Heart attack Maternal Grandmother   . Hypertension Maternal Grandmother   . Hyperlipidemia Maternal Grandmother    .Marland Kitchen. Social History   Socioeconomic History  . Marital status: Single    Spouse name: Not on file  . Number of children: Not on file  . Years of education: Not on file  . Highest education level: Not on file  Occupational History  . Not on file  Social Needs  . Financial resource strain: Not on file  . Food insecurity:    Worry: Not on file    Inability: Not on file  . Transportation needs:    Medical: Not on file    Non-medical:  Not on file  Tobacco Use  . Smoking status: Former Games developermoker  . Smokeless tobacco: Never Used  Substance and Sexual Activity  . Alcohol use: Yes    Alcohol/week: 0.0 standard drinks  . Drug use: No  . Sexual activity: Yes  Lifestyle  . Physical activity:    Days per week: Not on file    Minutes per session: Not on file  . Stress: Not on file  Relationships  . Social connections:    Talks on phone: Not on file    Gets together: Not on file    Attends religious service: Not on file    Active member of club or organization: Not on file    Attends meetings of clubs or organizations: Not on file    Relationship status: Not on file  . Intimate partner violence:    Fear of current or ex partner: Not on file    Emotionally abused: Not on file    Physically abused: Not on file    Forced sexual activity: Not on file  Other Topics Concern  . Not on file  Social History Narrative  . Not on file       Review of Systems  All other systems reviewed and are negative.      Objective:   Physical Exam BP Marland Kitchen(!)  147/79   Pulse 76   Ht 5\' 11"  (1.803 m)   Wt (!) 331 lb (150.1 kg)   BMI 46.17 kg/m   General Appearance:    Alert, cooperative, no distress, appears stated age  Head:    Normocephalic, without obvious abnormality, atraumatic  Eyes:    PERRL, conjunctiva/corneas clear, EOM's intact, fundi    benign, both eyes       Ears:    Normal TM's and external ear canals, both ears  Nose:   Nares normal, septum midline, mucosa normal, no drainage    or sinus tenderness  Throat:   Lips, mucosa, and tongue normal; teeth and gums normal  Neck:   Supple, symmetrical, trachea midline, no adenopathy;       thyroid:  No enlargement/tenderness/nodules; no carotid   bruit or JVD  Back:     Symmetric, no curvature, ROM normal, no CVA tenderness  Lungs:     Clear to auscultation bilaterally, respirations unlabored  Chest wall:    No tenderness or deformity  Heart:    Regular rate and rhythm, S1  and S2 normal, no murmur, rub   or gallop  Abdomen:     Soft, non-tender, bowel sounds active all four quadrants,    no masses, no organomegaly        Extremities:   Extremities normal, atraumatic, no cyanosis or edema  Pulses:   2+ and symmetric all extremities  Skin:   Skin color, texture, turgor normal, no rashes or lesions  Lymph nodes:   Cervical, supraclavicular, and axillary nodes normal  Neurologic:   CNII-XII intact. Normal strength, sensation and reflexes      throughout         Assessment & Plan:   Marland KitchenMarland KitchenDiagnoses and all orders for this visit:  Routine physical examination -     Tdap vaccine greater than or equal to 7yo IM  Need for Tdap vaccination -     Tdap vaccine greater than or equal to 7yo IM  Essential hypertension, benign -     lisinopril (PRINIVIL,ZESTRIL) 40 MG tablet; Take 1 tablet (40 mg total) by mouth daily. -     amLODipine (NORVASC) 10 MG tablet; Take 1 tablet (10 mg total) by mouth daily.  Morbid obesity (HCC) -     Lorcaserin HCl ER (BELVIQ XR) 20 MG TB24; Take 1 tablet by mouth daily. -     Amb Referral to Bariatric Surgery -     phentermine (ADIPEX-P) 37.5 MG tablet; Take 1 tablet (37.5 mg total) by mouth daily before breakfast.     .. Depression screen [facility] 2/9 09/06/2018 02/08/2018 02/20/2017  Decreased Interest 0 0 0  Down, Depressed, Hopeless 0 1 0  PHQ - 2 Score 0 1 0  Altered sleeping 0 - -  Tired, decreased energy 0 - -  Change in appetite 0 - -  Feeling bad or failure about yourself  0 - -  Trouble concentrating 0 - -  Moving slowly or fidgety/restless 0 - -  Suicidal thoughts 0 - -  PHQ-9 Score 0 - -  Difficult doing work/chores Not difficult at all - -   .Marland KitchenStart a regular exercise program and make sure you are eating a healthy diet Try to eat 4 servings of dairy a day or take a calcium supplement (500mg  twice a day). Your vaccines are up to date.  Fasting labs up to date.  Pt declined flu shot.  Tdap given today.   BP  elevated  but patient was out of medications. Refilled today.    Marland Kitchen.Discussed low carb diet with 1500 calories and 80g of protein.  Exercising at least 150 minutes a week.  My Fitness Pal could be a Chief Technology Officer.  Made referral to bariatric Discussed sleep study. Pt concerned about cost and being able to afford right now.  Discussed medication options. Decided on start with phentermine and transition to belviq. Watch for SE such as ED.  Follow up in 1 month with nurse visit.

## 2018-09-06 NOTE — Patient Instructions (Addendum)
saxenda    Health Maintenance, Male A healthy lifestyle and preventive care is important for your health and wellness. Ask your health care provider about what schedule of regular examinations is right for you. What should I know about weight and diet? Eat a Healthy Diet  Eat plenty of vegetables, fruits, whole grains, low-fat dairy products, and lean protein.  Do not eat a lot of foods high in solid fats, added sugars, or salt.  Maintain a Healthy Weight Regular exercise can help you achieve or maintain a healthy weight. You should:  Do at least 150 minutes of exercise each week. The exercise should increase your heart rate and make you sweat (moderate-intensity exercise).  Do strength-training exercises at least twice a week.  Watch Your Levels of Cholesterol and Blood Lipids  Have your blood tested for lipids and cholesterol every 5 years starting at 28 years of age. If you are at high risk for heart disease, you should start having your blood tested when you are 28 years old. You may need to have your cholesterol levels checked more often if: ? Your lipid or cholesterol levels are high. ? You are older than 28 years of age. ? You are at high risk for heart disease.  What should I know about cancer screening? Many types of cancers can be detected early and may often be prevented. Lung Cancer  You should be screened every year for lung cancer if: ? You are a current smoker who has smoked for at least 30 years. ? You are a former smoker who has quit within the past 15 years.  Talk to your health care provider about your screening options, when you should start screening, and how often you should be screened.  Colorectal Cancer  Routine colorectal cancer screening usually begins at 28 years of age and should be repeated every 5-10 years until you are 28 years old. You may need to be screened more often if early forms of precancerous polyps or small growths are found. Your health  care provider may recommend screening at an earlier age if you have risk factors for colon cancer.  Your health care provider may recommend using home test kits to check for hidden blood in the stool.  A small camera at the end of a tube can be used to examine your colon (sigmoidoscopy or colonoscopy). This checks for the earliest forms of colorectal cancer.  Prostate and Testicular Cancer  Depending on your age and overall health, your health care provider may do certain tests to screen for prostate and testicular cancer.  Talk to your health care provider about any symptoms or concerns you have about testicular or prostate cancer.  Skin Cancer  Check your skin from head to toe regularly.  Tell your health care provider about any new moles or changes in moles, especially if: ? There is a change in a mole's size, shape, or color. ? You have a mole that is larger than a pencil eraser.  Always use sunscreen. Apply sunscreen liberally and repeat throughout the day.  Protect yourself by wearing long sleeves, pants, a wide-brimmed hat, and sunglasses when outside.  What should I know about heart disease, diabetes, and high blood pressure?  If you are 50-90 years of age, have your blood pressure checked every 3-5 years. If you are 2 years of age or older, have your blood pressure checked every year. You should have your blood pressure measured twice-once when you are at a hospital or  clinic, and once when you are not at a hospital or clinic. Record the average of the two measurements. To check your blood pressure when you are not at a hospital or clinic, you can use: ? An automated blood pressure machine at a pharmacy. ? A home blood pressure monitor.  Talk to your health care provider about your target blood pressure.  If you are between 8245-28 years old, ask your health care provider if you should take aspirin to prevent heart disease.  Have regular diabetes screenings by checking your  fasting blood sugar level. ? If you are at a normal weight and have a low risk for diabetes, have this test once every three years after the age of 28. ? If you are overweight and have a high risk for diabetes, consider being tested at a younger age or more often.  A one-time screening for abdominal aortic aneurysm (AAA) by ultrasound is recommended for men aged 65-75 years who are current or former smokers. What should I know about preventing infection? Hepatitis B If you have a higher risk for hepatitis B, you should be screened for this virus. Talk with your health care provider to find out if you are at risk for hepatitis B infection. Hepatitis C Blood testing is recommended for:  Everyone born from [id number] through 1965.  Anyone with known risk factors for hepatitis C.  Sexually Transmitted Diseases (STDs)  You should be screened each year for STDs including gonorrhea and chlamydia if: ? You are sexually active and are younger than 28 years of age. ? You are older than 28 years of age and your health care provider tells you that you are at risk for this type of infection. ? Your sexual activity has changed since you were last screened and you are at an increased risk for chlamydia or gonorrhea. Ask your health care provider if you are at risk.  Talk with your health care provider about whether you are at high risk of being infected with HIV. Your health care provider may recommend a prescription medicine to help prevent HIV infection.  What else can I do?  Schedule regular health, dental, and eye exams.  Stay current with your vaccines (immunizations).  Do not use any tobacco products, such as cigarettes, chewing tobacco, and e-cigarettes. If you need help quitting, ask your health care provider.  Limit alcohol intake to no more than 2 drinks per day. One drink equals 12 ounces of beer, 5 ounces of wine, or 1 ounces of hard liquor.  Do not use street drugs.  Do not share  needles.  Ask your health care provider for help if you need support or information about quitting drugs.  Tell your health care provider if you often feel depressed.  Tell your health care provider if you have ever been abused or do not feel safe at home. This information is not intended to replace advice given to you by your health care provider. Make sure you discuss any questions you have with your health care provider. Document Released: 06/01/2008 Document Revised: 08/02/2016 Document Reviewed: 09/07/2015 Elsevier Interactive Patient Education  [employer]2018 [employer] Inc.

## 2018-10-04 ENCOUNTER — Ambulatory Visit (INDEPENDENT_AMBULATORY_CARE_PROVIDER_SITE_OTHER): Payer: 59 | Admitting: Sports Medicine

## 2018-10-04 DIAGNOSIS — Z139 Encounter for screening, unspecified: Secondary | ICD-10-CM

## 2018-10-04 DIAGNOSIS — I10 Essential (primary) hypertension: Secondary | ICD-10-CM

## 2018-10-04 DIAGNOSIS — S139XXA Sprain of joints and ligaments of unspecified parts of neck, initial encounter: Secondary | ICD-10-CM

## 2018-10-04 MED ORDER — LISINOPRIL-HYDROCHLOROTHIAZIDE 20-25 MG PO TABS: 1.0000 | ORAL_TABLET | Freq: Every day | ORAL | 3 refills | Status: DC

## 2018-10-04 MED ORDER — PHENTERMINE HCL 37.5 MG PO TABS: 37.5000 mg | ORAL_TABLET | Freq: Every day | ORAL | 0 refills | Status: DC

## 2018-10-04 MED ORDER — MELOXICAM 15 MG PO TABS: 15.0000 mg | ORAL_TABLET | Freq: Every day | ORAL | 0 refills | Status: DC

## 2018-10-04 NOTE — Assessment & Plan Note (Signed)
Still elevated, switching from lisinopril to lisinopril/HCTZ 20/25 mg. Continue amlodipine 10. Return in 2 weeks for a nurse visit blood pressure check.

## 2018-10-04 NOTE — Progress Notes (Signed)
Patient presents to clinic today for a blood pressure check. Patient denies any chest pain, shortness of breath, dizziness, fatigue, or medication problems. Patient states he is currently taking Amlodipine 10mg  and Lisinopril 40mg  daily. Patient states he does not check his blood pressure at home. First blood pressure reading was 151/71. Patient sat and relaxed for about 10 minutes and I rechecked BP, it was then 139/84. Information routed to covering provider.   Patient is also on Phentermine and wanted me to check his weight. Last weight check was 09/06/2018 and patient weight was 331lb. Today patient weighs 321lb.   Patient also states he has been having bad lower back pain and would like a refill on his Meloxicam if possible. No further questions or concerns at this time. Georgiann Cocker, CMA.

## 2018-10-04 NOTE — Progress Notes (Signed)
Morbid obesity (HCC) Refilling phentermine. Return in 1 month for a weight check.  Essential hypertension, benign Still elevated, switching from lisinopril to lisinopril/HCTZ 20/25 mg. Continue amlodipine 10. Return in 2 weeks for a nurse visit blood pressure check.

## 2018-10-04 NOTE — Assessment & Plan Note (Signed)
Refilling phentermine. Return in 1 month for a weight check.

## 2018-11-19 DIAGNOSIS — Z719 Counseling, unspecified: Secondary | ICD-10-CM | POA: Diagnosis not present

## 2019-01-24 ENCOUNTER — Telehealth: Payer: Self-pay

## 2019-01-24 DIAGNOSIS — S139XXA Sprain of joints and ligaments of unspecified parts of neck, initial encounter: Secondary | ICD-10-CM

## 2019-01-24 DIAGNOSIS — M5126 Other intervertebral disc displacement, lumbar region: Secondary | ICD-10-CM

## 2019-01-24 MED ORDER — PREDNISONE 50 MG PO TABS: ORAL_TABLET | ORAL | 0 refills | Status: DC

## 2019-01-24 MED ORDER — MELOXICAM 15 MG PO TABS: 15.0000 mg | ORAL_TABLET | Freq: Every day | ORAL | 1 refills | Status: DC

## 2019-01-24 NOTE — Telephone Encounter (Signed)
Dale Howard complains of sharp low back. He states he has had this in the past and prednisone helped. He states he went [place] on Sunday and believes this is the cause of the back pain. He would like an anti-inflammatory and prednisone sent to the pharmacy.

## 2019-01-24 NOTE — Telephone Encounter (Signed)
Hx of lumbar disc hernation. Sent prednisone burst and mobic to pharmacy.

## 2019-01-24 NOTE — Telephone Encounter (Signed)
Left message advising patient

## 2019-01-29 ENCOUNTER — Ambulatory Visit (INDEPENDENT_AMBULATORY_CARE_PROVIDER_SITE_OTHER): Payer: 59 | Admitting: Family Medicine

## 2019-01-29 ENCOUNTER — Encounter: Payer: Self-pay | Admitting: Family Medicine

## 2019-01-29 VITALS — BP 161/97 | HR 84 | Ht 70.0 in | Wt 332.0 lb

## 2019-01-29 DIAGNOSIS — M5441 Lumbago with sciatica, right side: Secondary | ICD-10-CM

## 2019-01-29 DIAGNOSIS — M79605 Pain in left leg: Secondary | ICD-10-CM

## 2019-01-29 DIAGNOSIS — M545 Low back pain, unspecified: Secondary | ICD-10-CM

## 2019-01-29 MED ORDER — CYCLOBENZAPRINE HCL 10 MG PO TABS: 10.0000 mg | ORAL_TABLET | Freq: Three times a day (TID) | ORAL | 0 refills | Status: DC | PRN

## 2019-01-29 NOTE — Progress Notes (Signed)
Dale Howard is a 28 y.o. male who presents to [facility] today for back pain.  Dale Howard has a history of lumbar disc herniation in 2018 resulting in left L5 radicular symptoms and left foot drop.  This is confirmed on MRI.  He had significant improvement with epidural steroid injection.  He did well until about 10 days ago.  10 days ago he was bowling and experienced low back pain.  Since then he has had somewhat worsening pain.  His pain maximized on Thursday and Friday, February 6 and 7.  He called the clinic and was sent in a prescription of oral prednisone.  He took that and notes that he got about 80% better but notes that the pain is worsening again this morning.  He notes worsening low back pain.  He notes some vague numb tingly sensation radiating down the right posterior thigh but not much beyond the knee.  He denies any significant left leg symptoms.  He denies any weakness or significant numbness distally.  No bowel or bladder dysfunction.  His back pain is moderate and worse with low back motion.  He has also tried some meloxicam which helps some.    ROS:  As above  Exam:  BP (!) 161/97   Pulse 84   Ht 5\' 10"  (1.778 m)   Wt (!) 332 lb (150.6 kg)   BMI 47.64 kg/m  Wt Readings from Last 5 Encounters:  01/29/19 (!) 332 lb (150.6 kg)  10/04/18 (!) 321 lb (145.6 kg)  09/06/18 (!) 331 lb (150.1 kg)  04/19/18 (!) 326 lb (147.9 kg)  03/08/18 (!) 327 lb (148.3 kg)   General: Morbidly obese,  and in no acute distress.  Neuro/Psych: Alert and oriented x3, extra-ocular muscles intact, able to move all 4 extremities, sensation grossly intact. Skin: Warm and dry, no rashes noted.  Respiratory: Not using accessory muscles, speaking in full sentences, trachea midline.  Cardiovascular: Pulses palpable, no extremity edema. Abdomen: Does not appear distended. MSK:  L-spine: Normal-appearing. Nontender to spinal midline.  Tender to palpation  lumbar paraspinal musculature. Lumbar motion intact extension rotation and lateral flexion.  Limited in flexion. Lower extremity strength is intact and equal bilateral lower extremities.  Reflexes are intact and equal bilateral lower extremities.  Sensation is intact throughout.     Lab and Radiology Results EXAM: MRI LUMBAR SPINE WITHOUT CONTRAST  TECHNIQUE: Multiplanar, multisequence MR imaging of the lumbar spine was performed. No intravenous contrast was administered.  COMPARISON:  Radiographs dated 11/21/2016  FINDINGS: Segmentation:  Standard.  Alignment:  Physiologic.  Vertebrae:  No fracture, evidence of discitis, or bone lesion.  Conus medullaris: Extends to the L1-2 level and appears normal.  Paraspinal and other soft tissues: Negative.  Disc levels:  T12-L1 and L1-2: Normal.  L2-3: Slight disc desiccation. Minimal disc bulge in and lateral to the left neural foramen with no neural impingement.  L3-4:  Normal.  L4-5: Prominent soft disc extrusion central and to the left markedly compressing the left side of the thecal sac. This should affect the left L5 and S1 nerves. There is a limbus type deformity of the posterosuperior aspect of the L4 vertebra at the site of the disc extrusion.  L5-S1: Small central soft disc protrusion slightly asymmetric to the left adjacent 2 but not compressing the left S1 nerve.  IMPRESSION: 1. Soft disc protrusion/extrusion at L4-5 central and to the left compressing the thecal sac and the left L5 and S1 nerves.  Associated limbus deformity of the posterosuperior endplate of L4. 2. Small central soft disc protrusion asymmetric to the left at L5-S1 without focal neural impingement.   Electronically Signed   By: Dale Howard M.D.   On: 02/12/2017 10:05 I personally (independently) visualized and performed the interpretation of the images attached in this note.     Assessment and Plan: 28 y.o. male with  back pain.  Very likely lumbosacral strain.  I am not convinced that his symptoms are radicular in nature or discogenic in nature.  However he did experience benefit with short course of oral steroid.  I think it is reasonable to proceed with a course of physical therapy.  As he had significant benefit previously with epidural steroid injection I think is reasonable also to pursue that in parallel.  I am less optimistic about this course of treatment however. Additionally will prescribe cyclobenzaprine.  Continue heating pad and relative rest.  Precautions reviewed.  Work note provided.   PDMP not reviewed this encounter. Orders Placed This Encounter  Procedures  . DG Epidurography    Order Specific Question:   Reason for Exam (SYMPTOM  OR DIAGNOSIS REQUIRED)    Answer:   right L4/L5 level    Order Specific Question:   Preferred imaging location?    Answer:   [id number]  . Ambulatory referral to Physical Therapy    Referral Priority:   Routine    Referral Type:   Physical Medicine    Referral Reason:   Specialty Services Required    Requested Specialty:   Physical Therapy   Meds ordered this encounter  Medications  . cyclobenzaprine (FLEXERIL) 10 MG tablet    Sig: Take 1 tablet (10 mg total) by mouth 3 (three) times daily as needed for muscle spasms.    Dispense:  30 tablet    Refill:  0    Historical information moved to improve visibility of documentation.  Past Medical History:  Diagnosis Date  . Hypertension   . Hypogonadism in male   . Obesity    No past surgical history on file. Social History   Tobacco Use  . Smoking status: Former Games developermoker  . Smokeless tobacco: Never Used  Substance Use Topics  . Alcohol use: Yes    Alcohol/week: 0.0 standard drinks   family history includes Heart attack in his maternal grandmother; Hyperlipidemia in his maternal grandmother; Hypertension in his maternal grandmother.  Medications: Current Outpatient Medications  Medication  Sig Dispense Refill  . amLODipine (NORVASC) 10 MG tablet Take 1 tablet (10 mg total) by mouth daily. 90 tablet 1  . cyclobenzaprine (FLEXERIL) 10 MG tablet Take 1 tablet (10 mg total) by mouth 3 (three) times daily as needed for muscle spasms. 30 tablet 0  . fluticasone (FLONASE) 50 MCG/ACT nasal spray Place 1 spray into both nostrils daily. 16 g 3  . lisinopril-hydrochlorothiazide (PRINZIDE,ZESTORETIC) 20-25 MG tablet Take 1 tablet by mouth daily. 30 tablet 3  . Lorcaserin HCl ER (BELVIQ XR) 20 MG TB24 Take 1 tablet by mouth daily. 30 tablet 1  . meloxicam (MOBIC) 15 MG tablet Take 1 tablet (15 mg total) by mouth daily. 30 tablet 1  . phentermine (ADIPEX-P) 37.5 MG tablet Take 1 tablet (37.5 mg total) by mouth daily before breakfast. 30 tablet 0   No current facility-administered medications for this visit.    Allergies  Allergen Reactions  . Penicillins Other (See Comments)    Unknown, childhood      Discussed warning  signs or symptoms. Please see discharge instructions. Patient expresses understanding.

## 2019-01-29 NOTE — Patient Instructions (Signed)
Thank you for coming in today. Attend physical therapy.  You should hear about back injection soon.  Let me know if you do not hear anything.   Recheck if not improving.   Come back or go to the emergency room if you notice new weakness new numbness problems walking or bowel or bladder problems.   Lumbosacral Strain Lumbosacral strain is an injury that causes pain in the lower back (lumbosacral spine). This injury usually occurs from overstretching the muscles or ligaments along your spine. A strain can affect one or more muscles or cord-like tissues that connect bones to other bones (ligaments). What are the causes? This condition may be caused by:  A hard, direct hit (blow) to the back.  Excessive stretching of the lower back muscles. This may result from: ? A fall. ? Lifting something heavy. ? Repetitive movements such as bending or crouching. What increases the risk? The following factors may increase your risk of getting this condition:  Participating in sports or activities that involve: ? A sudden twist of the back. ? Pushing or pulling motions.  Being overweight or obese.  Having poor strength and flexibility, especially tight hamstrings or weak muscles in the back or abdomen.  Having too much of a curve in the lower back.  Having a pelvis that is tilted forward. What are the signs or symptoms? The main symptom of this condition is pain in the lower back, at the site of the strain. Pain may extend (radiate) down one or both legs. How is this diagnosed? This condition is diagnosed based on:  Your symptoms.  Your medical history.  A physical exam. ? Your health care provider may push on certain areas of your back to determine the source of your pain. ? You may be asked to bend forward, backward, and side to side to assess the severity of your pain and your range of motion.  Imaging tests, such as: ? X-rays. ? MRI.  How is this treated? Treatment for this  condition may include:  Putting heat and cold on the affected area.  Medicines to help relieve pain and relax your muscles (muscle relaxants).  NSAIDs to help reduce swelling and discomfort. When your symptoms improve, it is important to gradually return to your normal routine as soon as possible to reduce pain, avoid stiffness, and avoid loss of muscle strength. Generally, symptoms should improve within 6 weeks of treatment. However, recovery time varies. Follow these instructions at home: Managing pain, stiffness, and swelling   If directed, put ice on the injured area during the first 24 hours after your strain. ? Put ice in a plastic bag. ? Place a towel between your skin and the bag. ? Leave the ice on for 20 minutes, 2-3 times a day.  If directed, put heat on the affected area as often as told by your health care provider. Use the heat source that your health care provider recommends, such as a moist heat pack or a heating pad. ? Place a towel between your skin and the heat source. ? Leave the heat on for 20-30 minutes. ? Remove the heat if your skin turns bright red. This is especially important if you are unable to feel pain, heat, or cold. You may have a greater risk of getting burned. Activity  Rest and return to your normal activities as told by your health care provider. Ask your health care provider what activities are safe for you.  Avoid activities that take a lot  of energy for as long as told by your health care provider. General instructions  Take over-the-counter and prescription medicines only as told by your health care provider.  Donot drive or use heavy machinery while taking prescription pain medicine.  Do not use any products that contain nicotine or tobacco, such as cigarettes and e-cigarettes. If you need help quitting, ask your health care provider.  Keep all follow-up visits as told by your health care provider. This is important. How is this  prevented?  Use correct form when playing sports and lifting heavy objects.  Use good posture when sitting and standing.  Maintain a healthy weight.  Sleep on a mattress with medium firmness to support your back.  Be safe and responsible while being active to avoid falls.  Do at least 150 minutes of moderate-intensity exercise each week, such as brisk walking or water aerobics. Try a form of exercise that takes stress off your back, such as swimming or stationary cycling.  Maintain physical fitness, including: ? Strength. ? Flexibility. ? Cardiovascular fitness. ? Endurance. Contact a health care provider if:  Your back pain does not improve after 6 weeks of treatment.  Your symptoms get worse. Get help right away if:  Your back pain is severe.  You cannot stand or walk.  You have difficulty controlling when you urinate or when you have a bowel movement.  You feel nauseous or you vomit.  Your feet get very cold.  You have numbness, tingling, weakness, or problems using your arms or legs.  You develop any of the following: ? Shortness of breath. ? Dizziness. ? Pain in your legs. ? Weakness in your buttocks or legs. ? Discoloration of the skin on your toes or legs. This information is not intended to replace advice given to you by your health care provider. Make sure you discuss any questions you have with your health care provider. Document Released: 09/13/2005 Document Revised: 06/23/2016 Document Reviewed: 05/07/2016 Elsevier Interactive Patient Education  2019 [employer]

## 2019-02-04 ENCOUNTER — Encounter: Payer: Self-pay | Admitting: Rehabilitative and Restorative Service Providers"

## 2019-02-04 ENCOUNTER — Ambulatory Visit: Payer: 59 | Admitting: Rehabilitative and Restorative Service Providers"

## 2019-02-04 ENCOUNTER — Other Ambulatory Visit: Payer: Self-pay

## 2019-02-04 DIAGNOSIS — M62838 Other muscle spasm: Secondary | ICD-10-CM | POA: Diagnosis not present

## 2019-02-04 DIAGNOSIS — R29898 Other symptoms and signs involving the musculoskeletal system: Secondary | ICD-10-CM

## 2019-02-04 DIAGNOSIS — M5441 Lumbago with sciatica, right side: Secondary | ICD-10-CM

## 2019-02-04 DIAGNOSIS — M5416 Radiculopathy, lumbar region: Secondary | ICD-10-CM

## 2019-02-04 NOTE — Patient Instructions (Signed)
Access Code: [email or web address]  URL: [email or web address]  Date: 02/04/2019  Prepared by: Corlis Leak   Exercises  Supine Hamstring Stretch with Strap - 10 reps - 1 sets - 30 seconds hold - 2x daily - 7x weekly  Supine Piriformis Stretch with Leg Straight - 3 reps - 1 sets - 30 seconds hold - 2x daily - 7x weekly  Supine Transversus Abdominis Bracing - Hands on Stomach - 10 reps - 1 sets - 10 sec hold - 2x daily - 7x weekly

## 2019-02-04 NOTE — Therapy (Addendum)
[facility] [employer] [address] [place], [place], [address] Phone: [phone]   Fax:  [phone]  Physical Therapy Evaluation  Patient Details  Name: Dale Howard MRN: [id number] Date of Birth: 03-24-1991 Referring Provider (PT): Dr Clementeen Graham    Encounter Date: 02/04/2019   Patient treatment in time 800; out 857 am   POC for 12 visits  Start 02/04/2019 End 03/18/2019   Past Medical History:  Diagnosis Date  . Hypertension   . Hypogonadism in male   . Obesity     History reviewed. No pertinent surgical history.  There were no vitals filed for this visit.   Subjective Assessment - 02/04/19 0815    Subjective  Patient reports that he was doing well until about 2 weeks ago when he was bowling. He felt some tightness and discomfort while bowling with symptoms worsening over the next few days. He has had pain in the LB and into the Rt LE - and he was unable to stand straight.     Pertinent History  HNP; LBP ~ 1 yr ago which resolved with medication; ESI; therapy - HNT    Currently in Pain?  Yes    Pain Score  5     Pain Location  Back    Pain Orientation  Right;Mid;Lower;Other (Comment)   middle of back and across the LB    Pain Descriptors / Indicators  Aching;Throbbing   pinching Rt LB area    Pain Type  Acute pain    Pain Radiating Towards  into the Rt LE top of the Rt thigh and back of the Rt calf - some tightness in the Lt calf     Pain Onset  1 to 4 weeks ago    Pain Frequency  Constant    Aggravating Factors   sit to stand; bending; lifting; prolonged sitting     Pain Relieving Factors  lying on side; TENS; meds          [facility] PT Assessment - 02/04/19 0001      Assessment   Medical Diagnosis  LBP Rt LE pain     Referring Provider (PT)  Dr Clementeen Graham     Onset Date/Surgical Date  01/19/19    Hand Dominance  Left    Next MD Visit  02/07/2019   [place] [facility]    Prior Therapy  here for same  symptoms ~ 1 yr ago       Precautions   Precautions  None      Balance Screen   Has the patient fallen in the past 6 months  No    Has the patient had a decrease in activity level because of a fear of falling?   No    Is the patient reluctant to leave their home because of a fear of falling?   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     Leisure  outside with kids; fishing; sedentary       Observation/Other Assessments   Focus on Therapeutic Outcomes (FOTO)   52% limitation       Sensation   Additional Comments  WFL's per pt report       Posture/Postural Control   Posture Comments  head forward; shoulders rounded and  elevated       AROM   Overall AROM Comments  LE ROM limited by obesity - hips tight end ranges     Lumbar Flexion  0% pain in the mid to LB     Lumbar Extension  20% pain in LB    Lumbar - Right Side Bend  65%     Lumbar - Left Side Bend  60% pulling Rt mid to LB    Lumbar - Right Rotation  70%    Lumbar - Left Rotation  65% pulling       Strength   Overall Strength Comments  WFL's bilat LE's       Flexibility   Hamstrings  tight Rt 60 deg; Lt 50 deg painful Lt     Quadriceps  WFL's bilat     Piriformis  tight Lt > Rt painful Lt       Palpation   Spinal mobility  Hypomobile in L4-5.    Palpation comment  muscular tightness bilat lower thoracic and lumbar paraspinals; Rt posterior hip through the piriformis and gluts       Special Tests   Other special tests  pain in the LB with Lt SLR  to ~ 30 deg no radicular pain       Transfers   Comments  pain moving sit to stand                 Objective measurements completed on examination: See above findings.      [facility] Adult PT Treatment/Exercise - 02/04/19 0001      Therapeutic Activites    Therapeutic Activities  --   started pt education  re back care     Lumbar Exercises: Stretches   Passive Hamstring Stretch  Right;Left;2 reps;30 seconds   supine with strap    Press Ups  10 reps   2-3 sec hold    Piriformis Stretch  Right;Left;2 reps;30 seconds   supine travell      Lumbar Exercises: Supine   Other Supine Lumbar Exercises  4 part core 10 sec hold x 10 reps with verbal and tactile cues        Moist Heat Therapy   Number Minutes Moist Heat  20 Minutes    Moist Heat Location  Lumbar Spine   thoracic      Electrical Stimulation   Electrical Stimulation Location  bilat thoracic and lumbar spine     Electrical Stimulation Action  IFC    Electrical Stimulation Parameters  to tolerance    Electrical Stimulation Goals  Pain;Tone                  PT Long Term Goals - 02/04/19 1240      PT LONG TERM GOAL #1   Title  I with advanced HEP ( 03/18/2019)     Time  6    Period  Weeks    Status  New      PT LONG TERM GOAL #2   Title  improve trunk ROM to 50-70% of normal range for pt (03/18/2019)    Time  6    Period  Weeks    Status  New      PT LONG TERM GOAL #3   Title  report overall reduction of pain in back to his baseline level of function (03/18/2019)    Time  6    Period  Weeks    Status  New      PT  LONG TERM GOAL #4   Title  improve FOTO =/< 32% limited (03/18/2019)     Time  6    Period  Weeks    Status  New      PT LONG TERM GOAL #5   Title  Patient demonstrates and verbalizes good back care principles (03/18/2019)     Time  6    Period  Weeks    Status  New             Plan - 02/04/19 0847    Clinical Impression Statement  Dale Howard presents with bilatmid to low back pain and intermittent Rt LE radiculr pain. Symptoms started following bowling 01/19/2019 and gradually progressed over the following two weeks. He reports some improvement with medications and rest but continued pain in the mid to low back. Patient has history of similar episode of radicular LBP ~ 1 yrs ago which resolved  with ESI and Physical Therapy. He has an ESI scheduled for Friday 02/07/2019. Upon evaluation, he has limited trunk and LE mobility; muscular tightness to palpation; radicular pain Rt LE; limited functional activities and work due to pain. Almin will benefit from PT to address problems identified and work on core stabilization thus decreasing risk for recurrent back problems.     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; obesity     Clinical Presentation  Evolving    Clinical Decision Making  Low    Rehab Potential  Good    PT Frequency  2x / week    PT Duration  6 weeks    PT Treatment/Interventions  Patient/family education;ADLs/Self Care Home Management;Cryotherapy;Electrical Stimulation;Iontophoresis 4mg /ml Dexamethasone;Moist Heat;Traction;Ultrasound;Dry needling;Manual techniques;Neuromuscular re-education;Therapeutic activities;Therapeutic exercise    PT Next Visit Plan  review HEP; progress with extension program and core stabilization; manual work vs DN to thoracolumbar musculature and Rt posterior hip; back care education; modalities as indicated     PT Home Exercise Plan   Access Code: [email or web address]     Consulted and Agree with Plan of Care  Patient       Patient will benefit from skilled therapeutic intervention in order to improve the following deficits and impairments:  Postural dysfunction, Improper body mechanics, Pain, Increased fascial restricitons, Increased muscle spasms, Hypomobility, Decreased mobility, Decreased range of motion, Decreased activity tolerance  Visit Diagnosis: Acute bilateral low back pain with right-sided sciatica - Plan: PT plan of care cert/re-cert  Other muscle spasm - Plan: PT plan of care cert/re-cert  Radiculopathy, lumbar region - Plan: PT plan of care cert/re-cert  Other symptoms and signs involving the  musculoskeletal system - Plan: PT plan of care cert/re-cert     Problem List Patient Active Problem List   Diagnosis Date Noted  . Persistent cough for 3 weeks or longer 01/03/2018  . Acute cervical sprain 01/03/2018  . Low back pain radiating to left leg 02/06/2017  . Drug-induced erectile dysfunction 10/13/2016  . Morbid obesity (HCC) 06/28/2016  . Male hypogonadism 06/28/2016  . Abnormal weight gain 06/28/2016  . Essential hypertension, benign 06/05/2016  . Elevated hemoglobin (HCC) 06/05/2016  . Low testosterone 05/19/2016  . Elevated ALT measurement 05/19/2016  . Vitamin D deficiency 05/19/2016  . Lumbar disc herniation 05/05/2016  . Other fatigue 05/05/2016    Dale Howard Rober Minion PT, MPH  02/04/2019, 12:52 PM  [facility] [phone]  [place] [address] [place], [place], [address] Phone: [phone]   Fax:  [phone]  Name: Dale Howard MRN: [id number] Date of Birth: 23-Dec-1990

## 2019-02-07 ENCOUNTER — Inpatient Hospital Stay: Admission: RE | Admit: 2019-02-07 | Payer: 59 | Source: Ambulatory Visit

## 2019-02-07 ENCOUNTER — Other Ambulatory Visit: Payer: 59

## 2019-02-10 ENCOUNTER — Encounter: Payer: Self-pay | Admitting: Physical Therapy

## 2019-02-10 ENCOUNTER — Ambulatory Visit (INDEPENDENT_AMBULATORY_CARE_PROVIDER_SITE_OTHER): Payer: 59 | Admitting: Physical Therapy

## 2019-02-10 DIAGNOSIS — M5416 Radiculopathy, lumbar region: Secondary | ICD-10-CM

## 2019-02-10 DIAGNOSIS — M5441 Lumbago with sciatica, right side: Secondary | ICD-10-CM

## 2019-02-10 DIAGNOSIS — M62838 Other muscle spasm: Secondary | ICD-10-CM

## 2019-02-10 NOTE — Therapy (Addendum)
[facility] [place] [address] [place], [place], [address] Phone: [phone]   Fax:  [phone]  Physical Therapy Treatment  Patient Details  Name: Dale Howard MRN: [id number] Date of Birth: 09/23/1991 Referring Provider (PT): Dr Clementeen Graham    Encounter Date: 02/10/2019  PT End of Session - 02/10/19 0814    Visit Number  2    Number of Visits  12    Date for PT Re-Evaluation  03/18/19    PT Start Time  0805   pt arrived late   PT Stop Time  0856    PT Time Calculation (min)  51 min    Activity Tolerance  Patient limited by pain    Behavior During Therapy  [facility] for tasks assessed/performed       Past Medical History:  Diagnosis Date  . Hypertension   . Hypogonadism in male   . Obesity     History reviewed. No pertinent surgical history.  There were no vitals filed for this visit.  Subjective Assessment - 02/10/19 0847    Subjective  Pt reports he was unable to receive injection last week due to inclement weather; rescheduled for this Friday.  "Today is the best I have felt since this all started". He has been trying to stand / sit up straight and bend at work with his legs instead of his back.     Currently in Pain?  Yes    Pain Score  5     Pain Location  Back    Pain Orientation  Right;Mid;Lower    Pain Descriptors / Indicators  Aching;Sharp    Aggravating Factors   bending    Pain Relieving Factors  lying on side or stomach; TENS         [facility] PT Assessment - 02/10/19 0001      Assessment   Medical Diagnosis  LBP Rt LE pain     Referring Provider (PT)  Dr Clementeen Graham     Onset Date/Surgical Date  01/19/19    Hand Dominance  Left    Next MD Visit  to be scheduled   [place] [facility]    Prior Therapy  here for same symptoms ~ 1 yr ago         [facility] Adult PT Treatment/Exercise - 02/10/19 0001      Exercises   Exercises  Lumbar      Lumbar Exercises: Stretches   Passive Hamstring  Stretch  Right;2 reps;30 seconds   supine, with strap, opp knee bent   Passive Hamstring Stretch Limitations  unable to tolerate LLE stretch    Prone on Elbows Stretch  5 reps;10 seconds    Quad Stretch  Right;Left;2 reps;30 seconds   prone with strap   Piriformis Stretch  Right;Left;2 reps;30 seconds   supine, fig 4, strap assist for LLE     Lumbar Exercises: Aerobic   Nustep  L5: arms/legs x 5 min (range to tolerance)      Modalities   Modalities  Electrical Stimulation;Moist Heat      Moist Heat Therapy   Number Minutes Moist Heat  15 Minutes   pt in prone position   Moist Heat Location  Lumbar Spine   thoracic      Electrical Stimulation   Electrical Stimulation Location  bilat thoracic and lumbar spine     Electrical Stimulation Action  IFC    Electrical Stimulation Parameters  intensity to pt tolerance x 15 min  Electrical Stimulation Goals  Pain;Tone      Manual Therapy   Manual Therapy  Soft tissue mobilization    Manual therapy comments  pt in prone position    Soft tissue mobilization  IASTM to bilat lowr thoracic / lumbar/ QL to decrease fascial tightness and pain;  pin and stretch with pressure to Rt hip rotators              PT Education - 02/10/19 0846    Education Details  HEP - updated    Person(s) Educated  Patient    Methods  Explanation;Handout    Comprehension  Verbalized understanding;Returned demonstration          PT Long Term Goals - 02/04/19 1240      PT LONG TERM GOAL #1   Title  I with advanced HEP ( 03/18/2019)     Time  6    Period  Weeks    Status  New      PT LONG TERM GOAL #2   Title  improve trunk ROM to 50-70% of normal range for pt (03/18/2019)    Time  6    Period  Weeks    Status  New      PT LONG TERM GOAL #3   Title  report overall reduction of pain in back to his baseline level of function (03/18/2019)    Time  6    Period  Weeks    Status  New      PT LONG TERM GOAL #4   Title  improve FOTO =/< 32%  limited (03/18/2019)     Time  6    Period  Weeks    Status  New      PT LONG TERM GOAL #5   Title  Patient demonstrates and verbalizes good back care principles (03/18/2019)     Time  6    Period  Weeks    Status  New            Plan - 02/10/19 4540    Clinical Impression Statement  Pt was unable to tolerate straightening Lt leg on NuStep and unable to tolerate supine hamstring and piriformis stretch due to increase pain in low back.  Pt point tender in bilat lower thoracic paraspinals as well as bilat QL.  Pt reported relief with press up.  Pt is gradually progressing towards goals.     Rehab Potential  Good    PT Frequency  2x / week    PT Duration  6 weeks    PT Treatment/Interventions  Patient/family education;ADLs/Self Care Home Management;Cryotherapy;Electrical Stimulation;Iontophoresis /ml Dexamethasone;Moist Heat;Traction;Ultrasound;Dry needling;Manual techniques;Neuromuscular re-education;Therapeutic activities;Therapeutic exercise    PT Next Visit Plan  review HEP; progress with extension program and core stabilization; manual work vs DN to thoracolumbar musculature and Rt posterior hip; back care education; modalities as indicated     PT Home Exercise Plan   Access Code: [email or web address]     Consulted and Agree with Plan of Care  Patient       Patient will benefit from skilled therapeutic intervention in order to improve the following deficits and impairments:  Postural dysfunction, Improper body mechanics, Pain, Increased fascial restricitons, Increased muscle spasms, Hypomobility, Decreased mobility, Decreased range of motion, Decreased activity tolerance  Visit Diagnosis: Acute bilateral low back pain with right-sided sciatica  Other muscle spasm  Radiculopathy, lumbar region     Problem List Patient Active Problem List   Diagnosis Date Noted  . Persistent cough  for 3 weeks or longer 01/03/2018  . Acute cervical sprain 01/03/2018  . Low back pain radiating to  left leg 02/06/2017  . Drug-induced erectile dysfunction 10/13/2016  . Morbid obesity (HCC) 06/28/2016  . Male hypogonadism 06/28/2016  . Abnormal weight gain 06/28/2016  . Essential hypertension, benign 06/05/2016  . Elevated hemoglobin (HCC) 06/05/2016  . Low testosterone 05/19/2016  . Elevated ALT measurement 05/19/2016  . Vitamin D deficiency 05/19/2016  . Lumbar disc herniation 05/05/2016  . Other fatigue 05/05/2016    Mayer Camel, PTA 02/10/19 8:51 AM  [facility] [address] [place], [place], [address] Phone: [phone]   Fax:  [phone]  Name: Dale Howard MRN: [id number] Date of Birth: 01/20/1991

## 2019-02-10 NOTE — Patient Instructions (Signed)
Access Code: [email or web address]  URL: [email or web address]  Date: 02/10/2019  Prepared by: Mayer Camel   Exercises  Prone Press Up on Elbows - 5 reps - 1 sets - 5-10 hold - 3x daily - 7x weekly  Supine Transversus Abdominis Bracing - Hands on Stomach - 10 reps - 1 sets - 10 sec hold - 2x daily - 7x weekly  Hooklying Hamstring Stretch with Strap - 2-3 reps - 1 sets - 20-30 seconds hold - 1x daily - 7x weekly  Supine Piriformis Stretch with Leg Straight - 3 reps - 1 sets - 30 seconds hold - 2x daily - 7x weekly  Supine Piriformis Stretch with Foot on Ground - 2-3 reps - 1 sets - 20-30 seconds hold - 1x daily - 7x weekly

## 2019-02-14 ENCOUNTER — Ambulatory Visit
Admission: RE | Admit: 2019-02-14 | Discharge: 2019-02-14 | Disposition: A | Discharge status: Home / Self Care | Payer: 59 | Source: Ambulatory Visit | Attending: Family Medicine | Admitting: Family Medicine

## 2019-02-14 ENCOUNTER — Encounter: Payer: 59 | Admitting: Physical Therapy

## 2019-02-14 DIAGNOSIS — M4727 Other spondylosis with radiculopathy, lumbosacral region: Secondary | ICD-10-CM | POA: Diagnosis not present

## 2019-02-14 MED ORDER — IOPAMIDOL (ISOVUE-M 200) INJECTION 41%
1.0000 mL | Freq: Once | INTRAMUSCULAR | Status: AC
  Administered 2019-02-14: 1 mL via EPIDURAL

## 2019-02-14 MED ORDER — METHYLPREDNISOLONE ACETATE 40 MG/ML INJ SUSP (RADIOLOG
120.0000 mg | Freq: Once | INTRAMUSCULAR | Status: AC
  Administered 2019-02-14: 120 mg via EPIDURAL

## 2019-02-14 NOTE — Discharge Instructions (Signed)

## 2019-03-11 ENCOUNTER — Telehealth: Payer: Self-pay | Admitting: Physical Therapy

## 2019-03-11 NOTE — Telephone Encounter (Signed)
Called patient to see how he was doing and inquire if he is interested in continuation of physical therapy.  Patient stated he is interested in continuation of care, but his work schedule has been busy.  He is interested in e-visits, should they become available, otherwise he will call to schedule in 2 weeks.  Patient made aware that [facility] are currently closed until 03/24/19 for COVID19 precautions.   Mayer Camel, PTA 03/11/19 11:47 AM

## 2019-03-28 ENCOUNTER — Ambulatory Visit: Payer: Self-pay | Admitting: Rehabilitative and Restorative Service Providers"

## 2019-03-28 ENCOUNTER — Telehealth: Payer: Self-pay | Admitting: Physician Assistant

## 2019-03-28 MED ORDER — BENZONATATE 200 MG PO CAPS: 200.0000 mg | ORAL_CAPSULE | Freq: Two times a day (BID) | ORAL | 0 refills | Status: DC | PRN

## 2019-03-28 MED ORDER — HYDROCOD POLST-CPM POLST ER 10-8 MG/5ML PO SUER: 5.0000 mL | Freq: Two times a day (BID) | ORAL | 0 refills | Status: DC | PRN

## 2019-03-28 NOTE — Telephone Encounter (Signed)
[facility] called with patient requesting "cough syrup given last year for same thing". He has had a cough for over a week and taking flonase, zyrtec, albuterol as needed. Last may had similar symptoms a cough syrup helped. Denies any fever, SOB, chest tightness. Will send tessalon pearls and cough syrup if not better call office on Monday morning.   Marland KitchenPDMP reviewed during this encounter. No concerns with controlled substances.

## 2019-03-31 NOTE — Telephone Encounter (Signed)
Called patient. He states since taking medication his cough and itching in throat is gone. He will contact our office is symptoms change/progress.

## 2019-04-02 ENCOUNTER — Encounter: Payer: Self-pay | Admitting: Physician Assistant

## 2019-04-02 ENCOUNTER — Telehealth (INDEPENDENT_AMBULATORY_CARE_PROVIDER_SITE_OTHER): Payer: 59 | Admitting: Physician Assistant

## 2019-04-02 ENCOUNTER — Other Ambulatory Visit: Payer: Self-pay

## 2019-04-02 VITALS — Ht 71.0 in

## 2019-04-02 DIAGNOSIS — J40 Bronchitis, not specified as acute or chronic: Secondary | ICD-10-CM

## 2019-04-02 DIAGNOSIS — J329 Chronic sinusitis, unspecified: Secondary | ICD-10-CM | POA: Diagnosis not present

## 2019-04-02 MED ORDER — ALBUTEROL SULFATE HFA 108 (90 BASE) MCG/ACT IN AERS: 2.0000 | INHALATION_SPRAY | Freq: Four times a day (QID) | RESPIRATORY_TRACT | 0 refills | Status: DC | PRN

## 2019-04-02 MED ORDER — HYDROCOD POLST-CPM POLST ER 10-8 MG/5ML PO SUER: 5.0000 mL | Freq: Two times a day (BID) | ORAL | 0 refills | Status: DC | PRN

## 2019-04-02 MED ORDER — AZITHROMYCIN 250 MG PO TABS: ORAL_TABLET | ORAL | 0 refills | Status: DC

## 2019-04-02 MED ORDER — PREDNISONE 50 MG PO TABS: ORAL_TABLET | ORAL | 0 refills | Status: DC

## 2019-04-02 NOTE — Progress Notes (Signed)
Patient ID: Dale Howard, male   DOB: Mar 05, 1991, 28 y.o.   MRN: [id number] .Marland KitchenVirtual Visit via Video Note  I connected with Dale Howard on 04/04/19 at  3:00 PM EDT by a video enabled telemedicine application and verified that I am speaking with the correct person using two identifiers.   I discussed the limitations of evaluation and management by telemedicine and the availability of in person appointments. The patient expressed understanding and agreed to proceed.  History of Present Illness: Pt is a 28 yo male who calls in with cough and sinus pressure for the last week. He called in on Friday for cough and got cough syrup and tessalon pearls. He has tried allergy medication with little benefit. He does feel a little short of breath and has cough. Cough is worse at night and was not able to sleep at all last night. He has a lot of sinus pressure and congestion. No fever, chills, body aches. No other sick contacts.   .. Active Ambulatory Problems    Diagnosis Date Noted  . Lumbar disc herniation 05/05/2016  . Other fatigue 05/05/2016  . Low testosterone 05/19/2016  . Elevated ALT measurement 05/19/2016  . Vitamin D deficiency 05/19/2016  . Essential hypertension, benign 06/05/2016  . Elevated hemoglobin (HCC) 06/05/2016  . Morbid obesity (HCC) 06/28/2016  . Male hypogonadism 06/28/2016  . Abnormal weight gain 06/28/2016  . Drug-induced erectile dysfunction 10/13/2016  . Low back pain radiating to left leg 02/06/2017  . Persistent cough for 3 weeks or longer 01/03/2018  . Acute cervical sprain 01/03/2018   Resolved Ambulatory Problems    Diagnosis Date Noted  . Morbid obesity due to excess calories (HCC) 05/05/2016   Past Medical History:  Diagnosis Date  . Hypertension   . Hypogonadism in male   . Obesity    Reviewed med, allergy, problem list.     Observations/Objective: No acute distress No labored breathing.  Normal appearance.   No vitals obtained.   Assessment  and Plan: Marland KitchenMarland KitchenSebastiaan was seen today for cough.  Diagnoses and all orders for this visit:  Sinobronchitis -     chlorpheniramine-HYDROcodone (TUSSIONEX) 10-8 MG/5ML SUER; Take 5 mLs by mouth every 12 (twelve) hours as needed. -     azithromycin (ZITHROMAX Z-PAK) 250 MG tablet; Take 2 tablets (500 mg) on  Day 1,  followed by 1 tablet (250 mg) once daily on Days 2 through 5. -     predniSONE (DELTASONE) 50 MG tablet; Take one tablet for 5 days. -     albuterol (PROVENTIL HFA;VENTOLIN HFA) 108 (90 Base) MCG/ACT inhaler; Inhale 2 puffs into the lungs every 6 (six) hours as needed for wheezing or shortness of breath.   Treated with zpak, prednisone, albuterol inhaler and tussionex. Discussed conservative treatment. Low risk COVID.   Follow Up Instructions:    I discussed the assessment and treatment plan with the patient. The patient was provided an opportunity to ask questions and all were answered. The patient agreed with the plan and demonstrated an understanding of the instructions.   The patient was advised to call back or seek an in-person evaluation if the symptoms worsen or if the condition fails to improve as anticipated.  I provided 10 minutes of non-face-to-face time during this encounter.   Tandy Gaw, PA-C

## 2019-04-02 NOTE — Progress Notes (Signed)
Cough worse since Monday. Worse at night when trying to sleep. Feels like post nasal drip. No sore throat. No other symptoms. He did have to use an inhaler from last year he used when he got short of breath during a coughing fit which helped. He is out of cough syrup, still using tessalon.

## 2019-04-04 ENCOUNTER — Encounter: Payer: Self-pay | Admitting: Physician Assistant

## 2019-04-11 ENCOUNTER — Other Ambulatory Visit: Payer: Self-pay

## 2019-04-11 ENCOUNTER — Ambulatory Visit (INDEPENDENT_AMBULATORY_CARE_PROVIDER_SITE_OTHER): Payer: 59 | Admitting: Rehabilitative and Restorative Service Providers"

## 2019-04-11 ENCOUNTER — Encounter: Payer: Self-pay | Admitting: Rehabilitative and Restorative Service Providers"

## 2019-04-11 DIAGNOSIS — M5416 Radiculopathy, lumbar region: Secondary | ICD-10-CM | POA: Diagnosis not present

## 2019-04-11 DIAGNOSIS — M5441 Lumbago with sciatica, right side: Secondary | ICD-10-CM

## 2019-04-11 DIAGNOSIS — M62838 Other muscle spasm: Secondary | ICD-10-CM | POA: Diagnosis not present

## 2019-04-11 DIAGNOSIS — R29898 Other symptoms and signs involving the musculoskeletal system: Secondary | ICD-10-CM

## 2019-04-11 DIAGNOSIS — M545 Low back pain, unspecified: Secondary | ICD-10-CM

## 2019-04-11 NOTE — Therapy (Signed)
[facility] [place] [address] [place], [place], [address] Phone: [phone]   Fax:  [phone]  Physical Therapy Treatment  Patient Details  Name: Dale Howard MRN: [id number] Date of Birth: 09-03-1991 Referring Provider (PT): Dr Clementeen GrahamEvan Corey    Encounter Date: 04/11/2019  PT End of Session - 04/11/19 1004    Visit Number  3    Number of Visits  12    Date for PT Re-Evaluation  05/23/19    PT Start Time  1003    PT Stop Time  1055   moist heat x 10 min end of treatment    PT Time Calculation (min)  52 min    Activity Tolerance  Patient tolerated treatment well       Past Medical History:  Diagnosis Date  . Hypertension   . Hypogonadism in male   . Obesity     History reviewed. No pertinent surgical history.  There were no vitals filed for this visit.  Subjective Assessment - 04/11/19 1005    Subjective  LBP is improving some - even with increase in work activities/tasks. He continues to have pain "some days". No pain today.     Currently in Pain?  No/denies         [facility] - 04/11/19 0001      Assessment   Medical Diagnosis  LBP Rt LE pain     Referring Provider (PT)  Dr Clementeen GrahamEvan Corey     Onset Date/Surgical Date  01/19/19    Hand Dominance  Left    Next MD Visit  PRN     Prior Therapy  here for same symptoms ~ 1 yr ago       Observation/Other Assessments   Focus on Therapeutic Outcomes (FOTO)   17% limitation       AROM   Lumbar Flexion  80%    Lumbar Extension  70%     Lumbar - Right Side Bend  90%    Lumbar - Left Side Bend  90%    Lumbar - Right Rotation  100%    Lumbar - Left Rotation  100%      Strength   Overall Strength Comments  WFL's bilat LE's       Flexibility   Hamstrings  tight Rt 65 deg; Lt 65 deg     Quadriceps  WFL's bilat     Piriformis  tight      Palpation   Spinal mobility  WFL's     Palpation comment  WFL's                    [facility] Adult PT  Treatment/Exercise - 04/11/19 0001      Lumbar Exercises: Stretches   Passive Hamstring Stretch  3 reps;30 seconds   supine with strap    Press Ups  10 reps;5 seconds      Lumbar Exercises: Standing   Row  Strengthening;Both;20 reps;Theraband   core engaged    Theraband Level (Row)  Level 4 (Blue)    Row Limitations  added bow and arrow step back x 20 each side blue TB - core engaged     Shoulder Extension  Strengthening;Both;20 reps;Theraband   core engaged    Theraband Level (Shoulder Extension)  Level 4 (Blue)      Lumbar Exercises: Seated   Other Seated Lumbar Exercises  sit to stand slow stand to sit x 10 reps  Lumbar Exercises: Supine   Clam  10 reps;5 seconds   alternate abduction in hooklying blue TB coire engaged    Bridge  10 reps;5 seconds   core engaged      Lumbar Exercises: Prone   Opposite Arm/Leg Raise  Right arm/Left leg;Left arm/Right leg;10 reps      Moist Heat Therapy   Number Minutes Moist Heat  10 Minutes    Moist Heat Location  Lumbar Spine             PT Education - 04/11/19 1047    Education Details  HEP     Person(s) Educated  Patient    Methods  Explanation;Demonstration;Tactile cues;Verbal cues;Handout    Comprehension  Verbalized understanding;Returned demonstration;Verbal cues required;Tactile cues required          PT Long Term Goals - 04/11/19 1012      PT LONG TERM GOAL #1   Title  I with advanced HEP ( 05/23/2019)     Time  6    Period  Weeks    Status  New      PT LONG TERM GOAL #2   Title  Increase core strength and stability with patient able to perform 20-30 min of core stabilizatoin ans strengthening exercises without difficulty 05/23/2019    Time  6    Period  Weeks    Status  New      PT LONG TERM GOAL #3   Title  report overall reduction of pain in back to 0/10 with function (05/23/2019)    Time  6    Period  Weeks    Status  New      PT LONG TERM GOAL #4   Title  improve FOTO =/< 32% limited (03/18/2019)      Baseline  17% limitation 04/11/2019    Time  6    Period  Weeks    Status  Achieved      PT LONG TERM GOAL #5   Title  Patient demonstrates and verbalizes good back care principles (05/23/2019)     Time  6    Period  Weeks    Status  New            Plan - 04/11/19 1004    Clinical Impression Statement  Improved symptoms since last visit. Patient seldom has LBP - most notices pain when he is fatigued and doesn't sleep well. Patient will benefit from T to ncresae core and LE strength and stability to help prevent recurrent symptoms.     Rehab Potential  Good    PT Frequency  2x / week    PT Duration  6 weeks    PT Treatment/Interventions  Patient/family education;ADLs/Self Care Home Management;Cryotherapy;Electrical Stimulation;Iontophoresis 4mg /ml Dexamethasone;Moist Heat;Traction;Ultrasound;Dry needling;Manual techniques;Neuromuscular re-education;Therapeutic activities;Therapeutic exercise    PT Next Visit Plan  review HEP; progress with extension program and core stabilization; back care education; modalities as indicated     PT Home Exercise Plan   Access Code: [email or web address]     Consulted and Agree with Plan of Care  Patient       Patient will benefit from skilled therapeutic intervention in order to improve the following deficits and impairments:  Postural dysfunction, Improper body mechanics, Pain, Increased fascial restricitons, Increased muscle spasms, Hypomobility, Decreased mobility, Decreased range of motion, Decreased activity tolerance  Visit Diagnosis: Acute bilateral low back pain with right-sided sciatica - Plan: PT plan of care cert/re-cert  Other muscle spasm - Plan: PT plan of care  cert/re-cert  Radiculopathy, lumbar region - Plan: PT plan of care cert/re-cert  Other symptoms and signs involving the musculoskeletal system - Plan: PT plan of care cert/re-cert  Acute bilateral low back pain without sciatica - Plan: PT plan of care cert/re-cert     Problem  List Patient Active Problem List   Diagnosis Date Noted  . Persistent cough for 3 weeks or longer 01/03/2018  . Acute cervical sprain 01/03/2018  . Low back pain radiating to left leg 02/06/2017  . Drug-induced erectile dysfunction 10/13/2016  . Morbid obesity (HCC) 06/28/2016  . Male hypogonadism 06/28/2016  . Abnormal weight gain 06/28/2016  . Essential hypertension, benign 06/05/2016  . Elevated hemoglobin (HCC) 06/05/2016  . Low testosterone 05/19/2016  . Elevated ALT measurement 05/19/2016  . Vitamin D deficiency 05/19/2016  . Lumbar disc herniation 05/05/2016  . Other fatigue 05/05/2016    Brandalyn Harting Rober Minion PT, MPH  04/11/2019, 10:57 AM  [facility] [address] [place], [place], [address] Phone: [phone]   Fax:  [phone]  Name: Dale Howard MRN: [id number] Date of Birth: 04/01/1991

## 2019-04-11 NOTE — Patient Instructions (Signed)
Access Code: [email or web address]  URL: [email or web address]  Date: 04/11/2019  Prepared by: Corlis Leak   Exercises  Supine Hamstring Stretch with Strap - 10 reps - 1 sets - 30 seconds hold - 2x daily - 7x weekly  Supine ITB Stretch with Strap - 3 reps - 1 sets - 30 seconds hold - 2x daily - 7x weekly  Supine Piriformis Stretch with Leg Straight - 3 reps - 1 sets - 30 seconds hold - 2x daily - 7x weekly  Prone Quadriceps Stretch with Strap - 3 reps - 1 sets - 30 seconds hold - 2x daily - 7x weekly  Hip Flexor Stretch at Edge of Bed - 3 reps - 1 sets - 30 seconds hold - 2x daily - 7x weekly  Seated Hip Flexor Stretch - 3 reps - 1 sets - 30 seconds hold - 2x daily - 7x weekly   Today  Prone Alternating Arm and Leg Lifts - 10 reps - 1-3 sets - 2-3 sec hold - 1x daily - 7x weekly  Pilates Bridge - 10 reps - 1-3 sets - 10 sec hold - 1x daily - 7x weekly  Hooklying Clamshell with Resistance - 10 reps - 1-3 sets - 5 sec hold - 1x daily - 7x weekly  Sit to Stand - 10 reps - 1-3 sets - 10 sec hold - 1x daily - 7x weekly  Scapular Retraction with Resistance - 10 reps - 3 sets - 2x daily - 7x weekly  Scapular Retraction with Resistance Advanced - 10 reps - 3 sets - 2x daily - 7x weekly

## 2019-04-17 ENCOUNTER — Ambulatory Visit (INDEPENDENT_AMBULATORY_CARE_PROVIDER_SITE_OTHER): Payer: 59 | Admitting: Physician Assistant

## 2019-04-17 VITALS — HR 106

## 2019-04-17 DIAGNOSIS — R05 Cough: Secondary | ICD-10-CM | POA: Diagnosis not present

## 2019-04-17 DIAGNOSIS — R0982 Postnasal drip: Secondary | ICD-10-CM

## 2019-04-17 DIAGNOSIS — J9801 Acute bronchospasm: Secondary | ICD-10-CM | POA: Diagnosis not present

## 2019-04-17 DIAGNOSIS — J309 Allergic rhinitis, unspecified: Secondary | ICD-10-CM | POA: Diagnosis not present

## 2019-04-17 DIAGNOSIS — R053 Chronic cough: Secondary | ICD-10-CM

## 2019-04-17 DIAGNOSIS — T464X5A Adverse effect of angiotensin-converting-enzyme inhibitors, initial encounter: Secondary | ICD-10-CM

## 2019-04-17 DIAGNOSIS — I10 Essential (primary) hypertension: Secondary | ICD-10-CM | POA: Diagnosis not present

## 2019-04-17 DIAGNOSIS — R058 Other specified cough: Secondary | ICD-10-CM

## 2019-04-17 MED ORDER — BUDESONIDE-FORMOTEROL FUMARATE 80-4.5 MCG/ACT IN AERO: 2.0000 | INHALATION_SPRAY | Freq: Two times a day (BID) | RESPIRATORY_TRACT | 0 refills | Status: DC

## 2019-04-17 MED ORDER — MONTELUKAST SODIUM 10 MG PO TABS: 10.0000 mg | ORAL_TABLET | Freq: Every day | ORAL | 0 refills | Status: DC

## 2019-04-17 MED ORDER — VALSARTAN-HYDROCHLOROTHIAZIDE 160-25 MG PO TABS: 1.0000 | ORAL_TABLET | Freq: Every day | ORAL | 0 refills | Status: DC

## 2019-04-17 MED ORDER — HYDROCODONE-HOMATROPINE 5-1.5 MG/5ML PO SYRP: 5.0000 mL | ORAL_SOLUTION | Freq: Every evening | ORAL | 0 refills | Status: AC | PRN

## 2019-04-17 NOTE — Progress Notes (Signed)
Virtual Visit via Video Note  I connected with Dale Howard on 04/24/19 at  2:40 PM EDT by a video enabled telemedicine application and verified that I am speaking with the correct person using two identifiers.   I discussed the limitations of evaluation and management by telemedicine and the availability of in person appointments. The patient expressed understanding and agreed to proceed.  History of Present Illness: HPI:                                                                Dale Howard is a 28 y.o. male   CC: cough  Reports nonproductive cough for approx 3 weeks. Treated on 03/28/19 and 04/02/19  with zpak, prednisone, albuterol inhaler and tussionex. Reports coughing spells throughout the day and worse at night. Cough is waking him up at night. Endorses a tickle in the back of his throat Denies fever, dyspnea, sputum production Nonsmoker No hx of asthma or lung disease He does take an ACE inhibitor  Endorses seasonal allergies He is taking Flonase and Allerclear Using Albuterol 3 times per day with moderate relief   Past Medical History:  Diagnosis Date  . Hypertension   . Hypogonadism in male   . Obesity    History reviewed. No pertinent surgical history. Social History   Tobacco Use  . Smoking status: Former Games developermoker  . Smokeless tobacco: Never Used  Substance Use Topics  . Alcohol use: Yes    Alcohol/week: 0.0 standard drinks   family history includes Heart attack in his maternal grandmother; Hyperlipidemia in his maternal grandmother; Hypertension in his maternal grandmother.    ROS: negative except as noted in the HPI  Medications: Current Outpatient Medications  Medication Sig Dispense Refill  . albuterol (PROVENTIL HFA;VENTOLIN HFA) 108 (90 Base) MCG/ACT inhaler Inhale 2 puffs into the lungs every 6 (six) hours as needed for wheezing or shortness of breath. 1 Inhaler 0  . amLODipine (NORVASC) 10 MG tablet Take 1 tablet (10 mg total) by mouth daily.  90 tablet 1  . fluticasone (FLONASE) 50 MCG/ACT nasal spray Place 1 spray into both nostrils daily. 16 g 3  . budesonide-formoterol (SYMBICORT) 80-4.5 MCG/ACT inhaler Inhale 2 puffs into the lungs 2 (two) times daily for 14 days. 1 Inhaler 0  . montelukast (SINGULAIR) 10 MG tablet Take 1 tablet (10 mg total) by mouth at bedtime. 90 tablet 0  . valsartan-hydrochlorothiazide (DIOVAN-HCT) 160-25 MG tablet Take 1 tablet by mouth daily. 90 tablet 0   No current facility-administered medications for this visit.    Allergies  Allergen Reactions  . Penicillins Other (See Comments)    Unknown, childhood       Objective:  Pulse (!) 106  Gen:  alert, not ill-appearing, no distress, appropriate for age HEENT: head normocephalic without obvious abnormality, conjunctiva and cornea clear, trachea midline Pulm: Normal work of breathing, normal phonation, speaking in full sentences Neuro: alert and oriented x 3   Assessment and Plan: 28 y.o. male with   .Dale Howard was seen today for follow-up.  Diagnoses and all orders for this visit:  Persistent cough for 3 weeks or longer -     HYDROcodone-homatropine (HYCODAN) 5-1.5 MG/5ML syrup; Take 5 mLs by mouth at bedtime as needed for up to 5 days for  cough. -     budesonide-formoterol (SYMBICORT) 80-4.5 MCG/ACT inhaler; Inhale 2 puffs into the lungs 2 (two) times daily for 14 days.  Bronchospasm -     budesonide-formoterol (SYMBICORT) 80-4.5 MCG/ACT inhaler; Inhale 2 puffs into the lungs 2 (two) times daily for 14 days.  Hypertension goal BP (blood pressure) < 130/80 -     valsartan-hydrochlorothiazide (DIOVAN-HCT) 160-25 MG tablet; Take 1 tablet by mouth daily.  Allergic rhinitis with postnasal drip -     montelukast (SINGULAIR) 10 MG tablet; Take 1 tablet (10 mg total) by mouth at bedtime.  ACE-inhibitor cough  Patient only able to provide manual pulse today, mildly tachycardic at 106, recent bronchodilator use Low clinical suspicion for  COVID-19 Will switch from ACE to ARB in case this is ACE-related cough Treat allergic rhinitis with leukotriene antagonist in addition to antihistamine and nasal steroid Start Symbicort 2 puffs bid for 2 weeks Hycodan for nocturnal cough Follow-up as needed if symptoms worsen or fail to improve    Follow Up Instructions:    I discussed the assessment and treatment plan with the patient. The patient was provided an opportunity to ask questions and all were answered. The patient agreed with the plan and demonstrated an understanding of the instructions.   The patient was advised to call back or seek an in-person evaluation if the symptoms worsen or if the condition fails to improve as anticipated.  I provided approx 15 minutes of non-face-to-face time during this encounter.   Carlis Stable, [place]

## 2019-04-18 ENCOUNTER — Encounter: Payer: 59 | Admitting: Physical Therapy

## 2019-04-24 ENCOUNTER — Encounter: Payer: Self-pay | Admitting: Physician Assistant

## 2019-04-24 DIAGNOSIS — I10 Essential (primary) hypertension: Secondary | ICD-10-CM | POA: Insufficient documentation

## 2019-04-24 DIAGNOSIS — R0982 Postnasal drip: Secondary | ICD-10-CM

## 2019-04-24 DIAGNOSIS — R05 Cough: Secondary | ICD-10-CM | POA: Insufficient documentation

## 2019-04-24 DIAGNOSIS — J9801 Acute bronchospasm: Secondary | ICD-10-CM | POA: Insufficient documentation

## 2019-04-24 DIAGNOSIS — T464X5A Adverse effect of angiotensin-converting-enzyme inhibitors, initial encounter: Secondary | ICD-10-CM

## 2019-04-24 DIAGNOSIS — J309 Allergic rhinitis, unspecified: Secondary | ICD-10-CM | POA: Insufficient documentation

## 2019-04-24 DIAGNOSIS — R058 Other specified cough: Secondary | ICD-10-CM | POA: Insufficient documentation

## 2019-04-25 ENCOUNTER — Other Ambulatory Visit: Payer: Self-pay

## 2019-04-25 ENCOUNTER — Ambulatory Visit (INDEPENDENT_AMBULATORY_CARE_PROVIDER_SITE_OTHER): Payer: 59 | Admitting: Physical Therapy

## 2019-04-25 ENCOUNTER — Encounter: Payer: Self-pay | Admitting: Physical Therapy

## 2019-04-25 DIAGNOSIS — M62838 Other muscle spasm: Secondary | ICD-10-CM

## 2019-04-25 DIAGNOSIS — M5441 Lumbago with sciatica, right side: Secondary | ICD-10-CM

## 2019-04-25 NOTE — Patient Instructions (Signed)
Access Code: [email or web address]  URL: [email or web address]  Date: 04/25/2019  Prepared by: Mayer Camel   Exercises  Hooklying Hamstring Stretch with Strap - 2 reps - 1 sets - 30 hold - 2x daily - 7x weekly  Seated Piriformis Stretch - 2 reps - 1 sets - 30 seconds hold - 2x daily - 7x weekly  Seated Hip Flexor Stretch - 3 reps - 1 sets - 30 seconds hold - 2x daily - 7x weekly  Prone Alternating Arm and Leg Lifts - 10 reps - 1 sets - 2-3 sec hold - 1x daily - 7x weekly  [place] - 10 reps - 1-3 sets - 10 sec hold - 1x daily - 7x weekly  Hooklying Clamshell with Resistance - 10 reps - 1-3 sets - 5 sec hold - 1x daily - 7x weekly  Sit to Stand - 10 reps - 1-3 sets - 10 sec hold - 1x daily - 7x weekly  Scapular Retraction with Resistance - 10 reps - 3 sets - 2x daily - 7x weekly  Scapular Retraction with Resistance Advanced - 10 reps - 3 sets - 2x daily - 7x weekly  Reclined Diaphragmatic Breathing - 5 reps - 1 sets - 10sec hold - 3x daily - 7x weekly

## 2019-04-25 NOTE — Therapy (Addendum)
[facility] [place], [place], [address] Phone: [phone]   Fax:  [phone]  Physical Therapy Treatment  Patient Details  Name: Nai Dasch MRN: [id number] Date of Birth: [birth year] Referring Provider (PT): Dr Lynne Leader    Encounter Date: 04/25/2019  PT End of Session - 04/25/19 0850    Visit Number  4    Number of Visits  12    Date for PT Re-Evaluation  05/23/19    PT Start Time  0801    PT Stop Time  6226   MHP last 10 min    PT Time Calculation (min)  54 min    Activity Tolerance  Patient tolerated treatment well    Behavior During Therapy  [facility] for tasks assessed/performed       Past Medical History:  Diagnosis Date  . Hypertension   . Hypogonadism in male   . Obesity     History reviewed. No pertinent surgical history.  There were no vitals filed for this visit.  Subjective Assessment - 04/25/19 0807    Subjective  Pt reports he has been working 6 days a week, with a lot of bending, crawling, pushing, pulling, etc.  He has had limited time and energy for the HEP.      Currently in Pain?  Yes    Pain Score  2     Pain Location  Back    Pain Orientation  Right;Mid;Lower    Pain Descriptors / Indicators  Dull    Aggravating Factors   sitting     Pain Relieving Factors  walking around, standing up.          [facility] PT Assessment - 04/25/19 0001      Assessment   Medical Diagnosis  LBP Rt LE pain     Referring Provider (PT)  Dr Lynne Leader     Onset Date/Surgical Date  01/19/19    Hand Dominance  Left    Next MD Visit  PRN     Prior Therapy  here for same symptoms ~ 1 yr ago       [facility] Adult PT Treatment/Exercise - 04/25/19 0001      Lumbar Exercises: Stretches   Passive Hamstring Stretch  Right;Left;2 reps;30 seconds    Passive Hamstring Stretch Limitations  difficulty tolerating LLE stretch; improved tolerance with slight bend in knee; trial in standing unsuccessful - seated  improved tolerance    Single Knee to Chest Stretch  Right;Left;2 reps;30 seconds    Hip Flexor Stretch  Right;Left;2 reps;30 seconds   arm overhead   Press Ups  1 rep    Piriformis Stretch  Right;Left;1 rep;30 seconds   seated     Lumbar Exercises: Aerobic   Nustep  L5: 5 min (legs only)      Lumbar Exercises: Standing   Shoulder Extension  Strengthening;Both;10 reps;Theraband   5 sec hold in ext.    Theraband Level (Shoulder Extension)  Level 3 (Green)      Lumbar Exercises: Prone   Opposite Arm/Leg Raise  Right arm/Left leg;Left arm/Right leg;5 reps   2 sets     Lumbar Exercises: Quadruped   [place]  5 reps      Moist Heat Therapy   Number Minutes Moist Heat  10 Minutes    Moist Heat Location  Lumbar Spine      Manual Therapy   Manual Therapy  Soft tissue mobilization;Muscle Energy Technique    Soft tissue mobilization  STM to Rt glute med, piriformis and QL     Muscle Energy Technique  MET correction for Rt sacral torsion (in prone)             PT Education - 04/25/19 0849    Education Details  updated HEP     Person(s) Educated  Patient    Methods  Explanation;Demonstration;Verbal cues   pt declined handout, has the app.    Comprehension  Verbalized understanding;Returned demonstration          PT Long Term Goals - 04/11/19 1012      PT LONG TERM GOAL #1   Title  I with advanced HEP ( 05/23/2019)     Time  6    Period  Weeks    Status  New      PT LONG TERM GOAL #2   Title  Increase core strength and stability with patient able to perform 20-30 min of core stabilizatoin ans strengthening exercises without difficulty 05/23/2019    Time  6    Period  Weeks    Status  New      PT LONG TERM GOAL #3   Title  report overall reduction of pain in back to 0/10 with function (05/23/2019)    Time  6    Period  Weeks    Status  New      PT LONG TERM GOAL #4   Title  improve FOTO =/< 32% limited (03/18/2019)     Baseline  17% limitation 04/11/2019     Time  6    Period  Weeks    Status  Achieved      PT LONG TERM GOAL #5   Title  Patient demonstrates and verbalizes good back care principles (05/23/2019)     Time  6    Period  Weeks    Status  New            Plan - 04/25/19 7078    Clinical Impression Statement  Pt had difficulty tolerating Lt hamstring stretch, reporting increased pain across low back and Lt buttock. Improved tolerance with slight bend in knee with Lt hamstring stretch.  Pelvis asymmetry noted with pt in prone; some improvement with MET. Pt reported reduction in LBP with exercises and further reduction with MHP at end of session.  Pt will benefit from continued PT intervention to maximize functional mobility and reduce incidence of reinjury.       Rehab Potential  Good    PT Frequency  2x / week    PT Duration  6 weeks    PT Treatment/Interventions  Patient/family education;ADLs/Self Care Home Management;Cryotherapy;Electrical Stimulation;Iontophoresis 45m/ml Dexamethasone;Moist Heat;Traction;Ultrasound;Dry needling;Manual techniques;Neuromuscular re-education;Therapeutic activities;Therapeutic exercise    PT Next Visit Plan  review HEP; progress with extension program and core stabilization; back care education; assess pelvis alignment.     PT Home Exercise Plan   Access Code: [email or web address]    Consulted and Agree with Plan of Care  Patient       Patient will benefit from skilled therapeutic intervention in order to improve the following deficits and impairments:  Postural dysfunction, Improper body mechanics, Pain, Increased fascial restricitons, Increased muscle spasms, Hypomobility, Decreased mobility, Decreased range of motion, Decreased activity tolerance  Visit Diagnosis: Acute bilateral low back pain with right-sided sciatica  Other muscle spasm     Problem List Patient Active Problem List   Diagnosis Date Noted  . Bronchospasm 04/24/2019  . Hypertension goal BP (blood pressure) < 130/80  04/24/2019  .  Allergic rhinitis with postnasal drip 04/24/2019  . ACE-inhibitor cough 04/24/2019  . Persistent cough for 3 weeks or longer 01/03/2018  . Acute cervical sprain 01/03/2018  . Low back pain radiating to left leg 02/06/2017  . Drug-induced erectile dysfunction 10/13/2016  . Morbid obesity ([place]) 06/28/2016  . Male hypogonadism 06/28/2016  . Abnormal weight gain 06/28/2016  . Essential hypertension, benign 06/05/2016  . Elevated hemoglobin ([place]) 06/05/2016  . Low testosterone 05/19/2016  . Elevated ALT measurement 05/19/2016  . Vitamin D deficiency 05/19/2016  . Lumbar disc herniation 05/05/2016  . Other fatigue 05/05/2016   Kerin Perna, PTA 04/25/19 9:02 AM  [place], [place], [address] Phone: [phone]   Fax:  [phone]  Name: Elon Eoff MRN: [id number] Date of Birth: 23-Feb-1991  PHYSICAL THERAPY DISCHARGE SUMMARY  Visits from Start of Care: 4  Current functional level related to goals / functional outcomes: See last progress note for discharge status    Remaining deficits: Unknown    Education / Equipment: HEP  Plan: Patient agrees to discharge.  Patient goals were partially met. Patient is being discharged due to not returning since the last visit.  ?????    Celyn P. Helene Kelp PT, MPH 05/22/19 5:22 PM

## 2019-05-02 ENCOUNTER — Encounter: Payer: 59 | Admitting: Physical Therapy

## 2019-05-28 ENCOUNTER — Encounter: Payer: Self-pay | Admitting: Family Medicine

## 2019-05-28 ENCOUNTER — Telehealth: Payer: Self-pay

## 2019-05-28 ENCOUNTER — Telehealth (INDEPENDENT_AMBULATORY_CARE_PROVIDER_SITE_OTHER): Payer: 59 | Admitting: Family Medicine

## 2019-05-28 VITALS — Ht 71.0 in | Wt 332.0 lb

## 2019-05-28 DIAGNOSIS — M79605 Pain in left leg: Secondary | ICD-10-CM | POA: Diagnosis not present

## 2019-05-28 DIAGNOSIS — Z20828 Contact with and (suspected) exposure to other viral communicable diseases: Secondary | ICD-10-CM | POA: Diagnosis not present

## 2019-05-28 DIAGNOSIS — Z20822 Contact with and (suspected) exposure to covid-19: Secondary | ICD-10-CM

## 2019-05-28 DIAGNOSIS — M545 Low back pain: Secondary | ICD-10-CM

## 2019-05-28 NOTE — Progress Notes (Signed)
Virtual Visit  via Video Note  I connected with      Dale Cerisesman Zebrowski by a video enabled telemedicine application and verified that I am speaking with the correct person using two identifiers.   I discussed the limitations of evaluation and management by telemedicine and the availability of in person appointments. The patient expressed understanding and agreed to proceed.  History of Present Illness: Dale Howard is a 28 y.o. male who would like to discuss COVID-19 exposure   Dale Howard received a letter from the health department that his daughter may have been exposed to COVID-19.  She is currently asymptomatic and the health department is recommending against testing for his daughter.  However he needs a negative test to return to work.Dale Howard is also completely asymptomatic.  He feels well with no fevers chills body aches cough or congestion.  Additionally. Dale Howard was last seen in February for back pain.  He has been receiving physical therapy intermittently for this.  His last visit was about a month ago.  He notes he is feeling pretty well and has had improvement with physical therapy home exercises and stretching.  He is happy with how things are going.  Observations/Objective: Ht 5\' 11"  (1.803 m)   Wt (!) 332 lb (150.6 kg)   BMI 46.30 kg/m  Wt Readings from Last 5 Encounters:  05/28/19 (!) 332 lb (150.6 kg)  01/29/19 (!) 332 lb (150.6 kg)  10/04/18 (!) 321 lb (145.6 kg)  09/06/18 (!) 331 lb (150.1 kg)  04/19/18 (!) 326 lb (147.9 kg)   Exam: Appearance nontoxic no acute distress Normal Speech.    Lab and Radiology Results No results found for this or any previous visit (from the past 72 hour(s)). No results found.   Assessment and Plan: 28 y.o. male with COVID-19 exposure.  Reasonable to proceed with outpatient nasal swab testing.  Will send to community testing pool for testing.  Will contact patient when tests are negative or back.  Work note provided.  Additionally back  pain: Doing well.  Continue home exercise program happy to reauthorize physical therapy if needed.  PDMP not reviewed this encounter. No orders of the defined types were placed in this encounter.  No orders of the defined types were placed in this encounter.   Follow Up Instructions:    I discussed the assessment and treatment plan with the patient. The patient was provided an opportunity to ask questions and all were answered. The patient agreed with the plan and demonstrated an understanding of the instructions.   The patient was advised to call back or seek an in-person evaluation if the symptoms worsen or if the condition fails to improve as anticipated.  Time: 15 minutes of intraservice time, with >22 minutes of total time during today's visit.      Historical information moved to improve visibility of documentation.  Past Medical History:  Diagnosis Date  . Hypertension   . Hypogonadism in male   . Obesity    No past surgical history on file. Social History   Tobacco Use  . Smoking status: Former Games developermoker  . Smokeless tobacco: Never Used  Substance Use Topics  . Alcohol use: Yes    Alcohol/week: 0.0 standard drinks   family history includes Heart attack in his maternal grandmother; Hyperlipidemia in his maternal grandmother; Hypertension in his maternal grandmother.  Medications: Current Outpatient Medications  Medication Sig Dispense Refill  . montelukast (SINGULAIR) 10 MG tablet Take 1 tablet (10 mg total) by  mouth at bedtime. 90 tablet 0  . valsartan-hydrochlorothiazide (DIOVAN-HCT) 160-25 MG tablet Take 1 tablet by mouth daily. 90 tablet 0  . albuterol (PROVENTIL HFA;VENTOLIN HFA) 108 (90 Base) MCG/ACT inhaler Inhale 2 puffs into the lungs every 6 (six) hours as needed for wheezing or shortness of breath. (Patient not taking: Reported on 05/28/2019) 1 Inhaler 0  . amLODipine (NORVASC) 10 MG tablet Take 1 tablet (10 mg total) by mouth daily. (Patient not taking:  Reported on 05/28/2019) 90 tablet 1  . budesonide-formoterol (SYMBICORT) 80-4.5 MCG/ACT inhaler Inhale 2 puffs into the lungs 2 (two) times daily for 14 days. 1 Inhaler 0  . fluticasone (FLONASE) 50 MCG/ACT nasal spray Place 1 spray into both nostrils daily. (Patient not taking: Reported on 05/28/2019) 16 g 3   No current facility-administered medications for this visit.    Allergies  Allergen Reactions  . Penicillins Other (See Comments)    Unknown, childhood

## 2019-05-28 NOTE — Telephone Encounter (Addendum)
Patient called and advised of the request for covid testing, appointment scheduled for tomorrow, 05/29/19 at 1500 at [facility], advised of location and to wear a mask for everyone in the vehicle, he verbalized understanding. Order placed.    ----- Message from Gregor Hams, MD sent at 05/28/2019 11:07 AM EDT ----- Regarding: needs COVID-19 test Please call and arrange outpatient COVID-19 PCR nasal test

## 2019-05-29 ENCOUNTER — Other Ambulatory Visit: Payer: 59

## 2019-05-29 ENCOUNTER — Ambulatory Visit: Payer: Self-pay

## 2019-05-29 NOTE — Telephone Encounter (Signed)
Patient requesting for Covid-19 test date be changed r/t Car issues. Reschedule for Monday 06/02/19 @1000am .  Patient voices understanding.

## 2019-06-02 ENCOUNTER — Other Ambulatory Visit: Payer: Self-pay | Admitting: Internal Medicine

## 2019-06-02 ENCOUNTER — Other Ambulatory Visit: Payer: 59

## 2019-06-04 LAB — NOVEL CORONAVIRUS, NAA: SARS-CoV-2, NAA: NOT DETECTED

## 2019-06-10 ENCOUNTER — Encounter (INDEPENDENT_AMBULATORY_CARE_PROVIDER_SITE_OTHER): Payer: Self-pay

## 2019-08-13 IMAGING — XA Imaging study
3 series · 3 of 3 positions shown · non-contrast
Comparison: none

CLINICAL DATA: Lumbosacral spondylosis without myelopathy with
radiculopathy. Chronic bilateral low back pain radiating into both
buttocks and thighs, worse on the right. L4-L5 disc herniation on
prior MRI from January 2017. L4-L5 interlaminar injection
requested.

[Series 3: ortho adipose · 1 of 1 slices shown (1 of 3)]
[im 1/1]
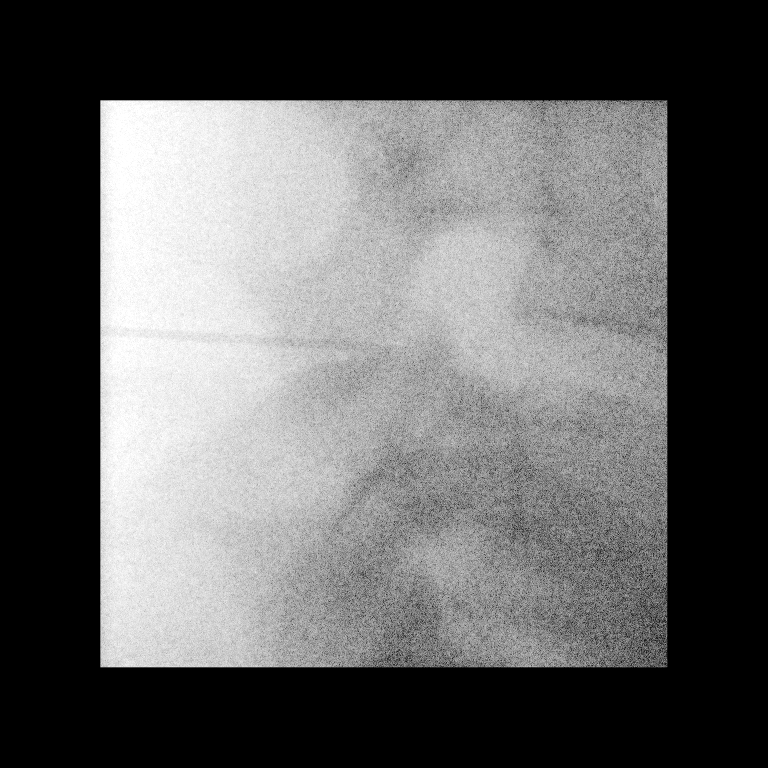

[Series 4: ortho adipose · 1 of 1 slices shown (2 of 3)]
[im 1/1]
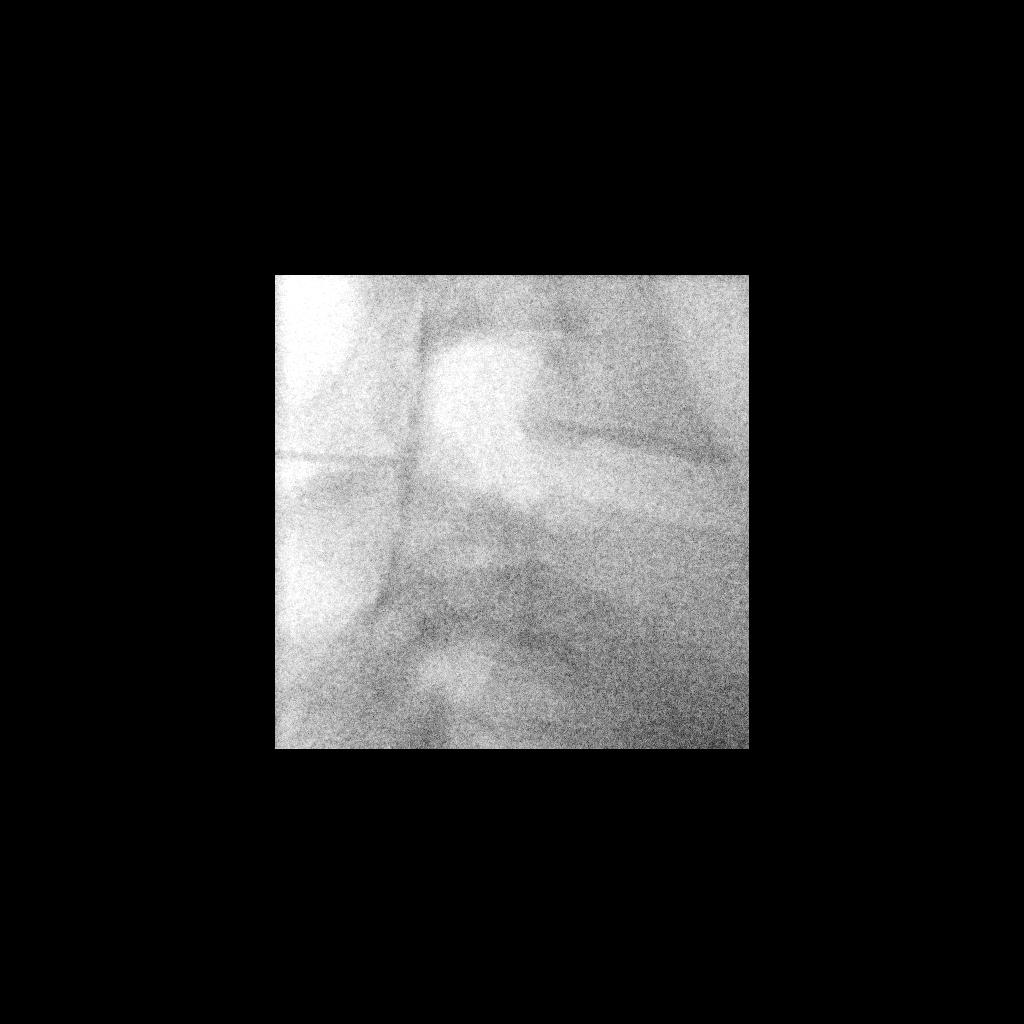

[Series 6: ortho adipose · 1 of 1 slices shown (3 of 3)]
[im 1/1]
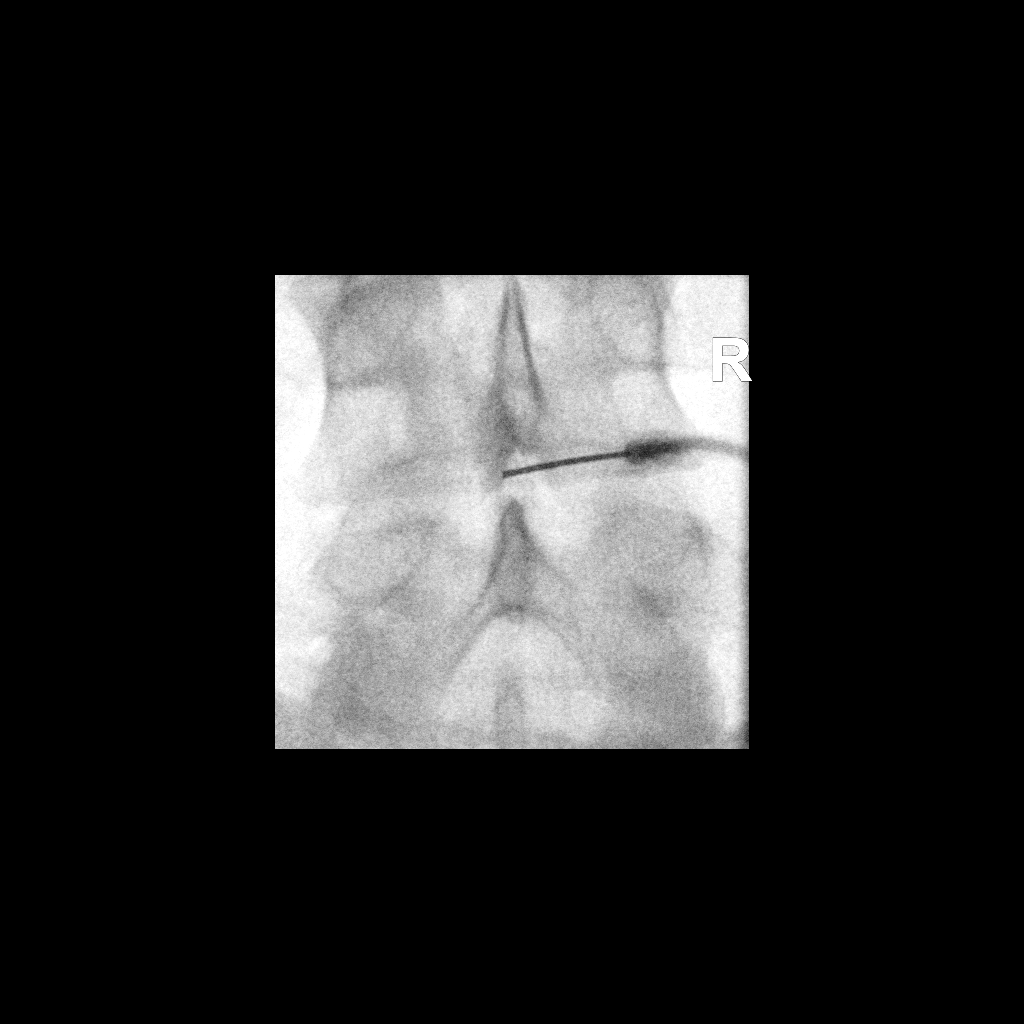

[3 of 3 positions shown; findings below may reference images not displayed]

FLUOROSCOPY TIME:  Radiation Exposure Index (as provided by the
fluoroscopic device): 7.8 mGy

Number of Acquired Images:  0

PROCEDURE:
The procedure, risks, benefits, and alternatives were explained to
the patient. Questions regarding the procedure were encouraged and
answered. The patient understands and consents to the procedure.

LUMBAR EPIDURAL INJECTION:

An interlaminar approach was performed on the right at L4-L5. The
overlying skin was cleansed and anesthetized. A 3.5 inch 20 gauge
epidural needle was advanced using loss-of-resistance technique.

DIAGNOSTIC EPIDURAL INJECTION:

Injection of Isovue-M 200 shows a good epidural pattern with spread
above and below the level of needle placement, primarily on the
left. No vascular opacification is seen.

THERAPEUTIC EPIDURAL INJECTION:

120 mg of Depo-Medrol mixed with 3 mL of 1% lidocaine were
instilled. The procedure was well-tolerated, and the patient was
discharged thirty minutes following the injection in good condition.

COMPLICATIONS:
None immediate.
IMPRESSION: Technically successful epidural injection on the right at L4-L5 #1.

## 2019-12-23 ENCOUNTER — Encounter: Payer: 59 | Admitting: Physician Assistant

## 2020-01-09 ENCOUNTER — Encounter: Payer: Self-pay | Admitting: Physician Assistant

## 2020-01-09 ENCOUNTER — Ambulatory Visit (INDEPENDENT_AMBULATORY_CARE_PROVIDER_SITE_OTHER): Payer: Managed Care, Other (non HMO) | Admitting: Physician Assistant

## 2020-01-09 ENCOUNTER — Other Ambulatory Visit: Payer: Self-pay

## 2020-01-09 VITALS — BP 135/79 | HR 88 | Ht 71.0 in | Wt 313.0 lb

## 2020-01-09 DIAGNOSIS — Z131 Encounter for screening for diabetes mellitus: Secondary | ICD-10-CM | POA: Diagnosis not present

## 2020-01-09 DIAGNOSIS — Z0000 Encounter for general adult medical examination without abnormal findings: Secondary | ICD-10-CM

## 2020-01-09 DIAGNOSIS — Z13220 Encounter for screening for lipoid disorders: Secondary | ICD-10-CM | POA: Diagnosis not present

## 2020-01-09 NOTE — Patient Instructions (Signed)

## 2020-01-09 NOTE — Progress Notes (Addendum)
Acute Office Visit  Subjective:    Patient ID: Dale Howard, male    DOB: Jan 09, 1991, 29 y.o.   MRN: [id number]  Chief Complaint  Patient presents with  . 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 Hypertension   . Hypogonadism in male   . Obesity     No past surgical history on file.  Family History  Problem Relation Age of Onset  . Heart attack Maternal Grandmother   . Hypertension Maternal Grandmother   . Hyperlipidemia Maternal Grandmother     Social History   Socioeconomic History  . Marital status: Single    Spouse name: Not on file  . Number of children: Not on file  . Years of education: Not on file  . Highest education level: Not on file  Occupational History  . Not on file  Tobacco Use  . Smoking status: Former Games developer  . Smokeless tobacco: Never Used  Substance and Sexual Activity  . Alcohol use: Yes    Alcohol/week: 0.0 standard drinks  . Drug use: No  . Sexual activity: Yes  Other Topics Concern  . Not on file  Social History Narrative  . Not on file   Social Determinants of Health   Financial Resource Strain:   . Difficulty of Paying Living Expenses: Not on file  Food Insecurity:   . Worried About Programme researcher, broadcasting/film/video in the Last Year: Not on file  . Ran Out of Food in the Last Year: Not on file  Transportation Needs:   . Lack of Transportation (Medical): Not on file  . Lack of Transportation (Non-Medical): Not on file  Physical  Activity:   . Days of Exercise per Week: Not on file  . Minutes of Exercise per Session: Not on file  Stress:   . Feeling of Stress : Not on file  Social Connections:   . Frequency of Communication with Friends and Family: Not on file  . Frequency of Social Gatherings with Friends and Family: Not on file  . Attends Religious Services: Not on file  . Active Member of Clubs or Organizations: Not on file  . Attends Banker Meetings: Not on file  . Marital Status: Not on file  Intimate Partner Violence:   . Fear of Current or Ex-Partner: Not on file  . Emotionally Abused: Not on file  . Physically Abused: Not on file  . Sexually Abused: Not on file    Outpatient Medications Prior to Visit  Medication Sig Dispense Refill  . phentermine (ADIPEX-P) 37.5 MG tablet Take 37.5 mg by mouth every morning.    . valsartan-hydrochlorothiazide (DIOVAN-HCT) 160-25 MG tablet Take 1 tablet by mouth daily. 90 tablet 0  . albuterol (PROVENTIL HFA;VENTOLIN HFA) 108 (90 Base) MCG/ACT inhaler Inhale 2 puffs into the lungs every 6 (six) hours as needed for wheezing or shortness of breath. (Patient not taking: Reported on 05/28/2019) 1 Inhaler 0  . amLODipine (NORVASC) 10 MG tablet Take  1 tablet (10 mg total) by mouth daily. (Patient not taking: Reported on 05/28/2019) 90 tablet 1  . budesonide-formoterol (SYMBICORT) 80-4.5 MCG/ACT inhaler Inhale 2 puffs into the lungs 2 (two) times daily for 14 days. 1 Inhaler 0  . fluticasone (FLONASE) 50 MCG/ACT nasal spray Place 1 spray into both nostrils daily. (Patient not taking: Reported on 05/28/2019) 16 g 3  . montelukast (SINGULAIR) 10 MG tablet Take 1 tablet (10 mg total) by mouth at bedtime. 90 tablet 0   No facility-administered medications prior to visit.    Allergies  Allergen Reactions  . Penicillins Other (See Comments)    Unknown, childhood    Review of Systems Pt denies any recent cold like symptoms, malaise, N/V. Pt does not have any  complaints.    Objective:    Physical Exam Heart RRR, Lungs CTAB. No lymphadenopathy or thyromegaly present. Pupils equally reactive to light, no papilledema or swelling of the optic disk, no hemorrhages present on eye exam. Tympanic membrane and cone of light visualized bilaterally, mild cerumen present. No erythema or discharge present in the external auditory canal. Nasal mucosa pink and moist. Oropharynx free of lesions. Obese abdomen was nontender and soft in all 4 quadrants. BP 135/79   Pulse 88   Ht 5\' 11"  (1.803 m)   Wt (!) 142 kg   SpO2 99%   BMI 43.65 kg/m  Wt Readings from Last 3 Encounters:  01/09/20 (!) 142 kg  05/28/19 (!) 150.6 kg  01/29/19 (!) 150.6 kg    Health Maintenance Due  Topic Date Due  . HIV Screening  03/17/2005    There are no preventive care reminders to display for this patient.   Lab Results  Component Value Date   TSH 1.18 03/08/2018   Lab Results  Component Value Date   WBC 7.8 03/08/2018   HGB 16.5 03/08/2018   HCT 47.1 03/08/2018   MCV 84.4 03/08/2018   PLT 201 03/08/2018   Lab Results  Component Value Date   NA 140 03/08/2018   K 4.3 03/08/2018   CO2 29 03/08/2018   GLUCOSE 94 03/08/2018   BUN 19 03/08/2018   CREATININE 0.86 03/08/2018   BILITOT 0.5 03/08/2018   ALKPHOS 101 06/27/2016   AST 27 03/08/2018   ALT 39 03/08/2018   PROT 7.3 03/08/2018   ALBUMIN 4.4 06/27/2016   ALBUMIN 4.4 06/27/2016   CALCIUM 9.5 03/08/2018   Lab Results  Component Value Date   CHOL 168 03/08/2018   Lab Results  Component Value Date   HDL 41 03/08/2018   Lab Results  Component Value Date   LDLCALC 110 (H) 03/08/2018   Lab Results  Component Value Date   TRIG 83 03/08/2018   Lab Results  Component Value Date   CHOLHDL 4.1 03/08/2018      Assessment & Plan:  Marland KitchenMarland KitchenJacky was seen today for annual exam.  Diagnoses and all orders for this visit:  Routine physical examination -     Lipid Panel w/reflex Direct LDL -     COMPLETE  METABOLIC PANEL WITH GFR -     TSH -     CBC  Screening for diabetes mellitus -     COMPLETE METABOLIC PANEL WITH GFR  Screening for lipid disorders -     Lipid Panel w/reflex Direct LDL  Morbid obesity (HCC) -     TSH -     CBC   .Marland Kitchen Depression screen [facility] 2/9 01/09/2020 09/06/2018 02/08/2018 02/20/2017  Decreased Interest  0 0 0 0  Down, Depressed, Hopeless 0 0 1 0  PHQ - 2 Score 0 0 1 0  Altered sleeping 0 0 - -  Tired, decreased energy 0 0 - -  Change in appetite 0 0 - -  Feeling bad or failure about yourself  0 0 - -  Trouble concentrating 0 0 - -  Moving slowly or fidgety/restless 0 0 - -  Suicidal thoughts 0 0 - -  PHQ-9 Score 0 0 - -  Difficult doing work/chores Not difficult at all Not difficult at all - -   .Marland KitchenStart a regular exercise program and make sure you are eating a healthy diet Try to eat 4 servings of dairy a day or take a calcium supplement (500mg  twice a day). Your vaccines are up to date.  Fasting labs ordered.  Continue to work on weight loss.      , Student-PA   .Marlyn Corporal PA-C, have reviewed and agree with the above documentation in it's entirety.

## 2020-01-10 LAB — COMPLETE METABOLIC PANEL WITH GFR
AG Ratio: 1.5 (calc) (ref 1.0–2.5)
ALT: 43 U/L (ref 9–46)
AST: 32 U/L (ref 10–40)
Albumin: 4.7 g/dL (ref 3.6–5.1)
Alkaline phosphatase (APISO): 86 U/L (ref 36–130)
BUN: 16 mg/dL (ref 7–25)
CO2: 28 mmol/L (ref 20–32)
Calcium: 9.8 mg/dL (ref 8.6–10.3)
Chloride: 99 mmol/L (ref 98–110)
Creat: 0.91 mg/dL (ref 0.60–1.35)
GFR, Est African American: 132 mL/min/{1.73_m2} (ref >=60)
GFR, Est Non African American: 113 mL/min/{1.73_m2} (ref >=60)
Globulin: 3.1 g/dL (calc) (ref 1.9–3.7)
Glucose, Bld: 78 mg/dL (ref 65–99)
Potassium: 4.5 mmol/L (ref 3.5–5.3)
Sodium: 136 mmol/L (ref 135–146)
Total Bilirubin: 0.7 mg/dL (ref 0.2–1.2)
Total Protein: 7.8 g/dL (ref 6.1–8.1)

## 2020-01-10 LAB — CBC
HCT: 49.7 % (ref 38.5–50.0)
Hemoglobin: 17.1 g/dL (ref 13.2–17.1)
MCH: 30.4 pg (ref 27.0–33.0)
MCHC: 34.4 g/dL (ref 32.0–36.0)
MCV: 88.3 fL (ref 80.0–100.0)
MPV: 12 fL (ref 7.5–12.5)
Platelets: 202 10*3/uL (ref 140–400)
RBC: 5.63 10*6/uL (ref 4.20–5.80)
RDW: 13.4 % (ref 11.0–15.0)
WBC: 9.3 10*3/uL (ref 3.8–10.8)

## 2020-01-10 LAB — LIPID PANEL W/REFLEX DIRECT LDL
Cholesterol: 180 mg/dL (ref <200)
HDL: 39 mg/dL — ABNORMAL LOW (ref >=40)
LDL Cholesterol (Calc): 123 mg/dL (calc) — ABNORMAL HIGH
Non-HDL Cholesterol (Calc): 141 mg/dL (calc) — ABNORMAL HIGH (ref <130)
Total CHOL/HDL Ratio: 4.6 (calc) (ref <5.0)
Triglycerides: 79 mg/dL (ref <150)

## 2020-01-10 LAB — TSH: TSH: 1.11 mIU/L (ref 0.40–4.50)

## 2020-01-12 NOTE — Progress Notes (Signed)
Dale Howard,   Good cholesterol down. Which we want higher. Exercise can increase this.  LDL up which weight loss, decrease fried foods can help with. TG great.  All other labs look really good.

## 2020-02-03 ENCOUNTER — Other Ambulatory Visit: Payer: Self-pay | Admitting: Physician Assistant

## 2020-02-03 DIAGNOSIS — I10 Essential (primary) hypertension: Secondary | ICD-10-CM

## 2020-02-05 MED ORDER — VALSARTAN-HYDROCHLOROTHIAZIDE 160-25 MG PO TABS: 1.0000 | ORAL_TABLET | Freq: Every day | ORAL | 0 refills | Status: DC

## 2020-02-14 ENCOUNTER — Encounter: Payer: Self-pay | Admitting: Physician Assistant

## 2020-04-15 ENCOUNTER — Encounter: Payer: Self-pay | Admitting: Nurse Practitioner

## 2020-04-15 ENCOUNTER — Telehealth (INDEPENDENT_AMBULATORY_CARE_PROVIDER_SITE_OTHER): Payer: Managed Care, Other (non HMO) | Admitting: Nurse Practitioner

## 2020-04-15 DIAGNOSIS — Z9109 Other allergy status, other than to drugs and biological substances: Secondary | ICD-10-CM

## 2020-04-15 DIAGNOSIS — J45901 Unspecified asthma with (acute) exacerbation: Secondary | ICD-10-CM

## 2020-04-15 DIAGNOSIS — Z114 Encounter for screening for human immunodeficiency virus [HIV]: Secondary | ICD-10-CM | POA: Diagnosis not present

## 2020-04-15 MED ORDER — ALBUTEROL SULFATE HFA 108 (90 BASE) MCG/ACT IN AERS: 1.0000 | INHALATION_SPRAY | Freq: Four times a day (QID) | RESPIRATORY_TRACT | 3 refills | Status: DC | PRN

## 2020-04-15 MED ORDER — MONTELUKAST SODIUM 10 MG PO TABS: 10.0000 mg | ORAL_TABLET | Freq: Every day | ORAL | 3 refills | Status: DC

## 2020-04-15 MED ORDER — BUDESONIDE-FORMOTEROL FUMARATE 160-4.5 MCG/ACT IN AERO: 2.0000 | INHALATION_SPRAY | Freq: Two times a day (BID) | RESPIRATORY_TRACT | 3 refills | Status: DC

## 2020-04-15 NOTE — Progress Notes (Signed)
Virtual Visit via MyChart Note  Visit changed to telephone encounter due to technical difficulties.  I connected with  Dale Howard on 04/15/20 at  9:30 AM EDT by the video enabled telemedicine application, MyChart, and verified that I am speaking with the correct person using two identifiers.   I introduced myself as a Designer, jewellery with the practice. We discussed the limitations of evaluation and management by telemedicine and the availability of in person appointments. The patient expressed understanding and agreed to proceed.  The patient is: at home I am: in the office  Subjective:    CC: Seasonal allergies with Wheezing and Shortness of Breath  HPI: Dale Howard is a 29 y.o. y/o male presenting via [facility] today for symptoms of seasonal allergies that have been ongoing for approximately the past month.  He reports postnasal drip, rhinorrhea, sore throat, increased cough with mucus production, shortness of breath with cough, audible wheezing, sinus pressure and congestion.  He does report his symptoms are worse at night and do seem to get better during the day.  His symptoms are keeping him up at night and not allowing him to rest well.  He is taking diphenhydramine during the day which is helping with his symptoms.  He has had this problem in the past since moving to [place] and was successfully treated with albuterol inhaler, Symbicort inhaler, and montelukast.  He denies fever, chills, ear pain/pressure, dizziness, lightheadedness.  Past medical history, Surgical history, Family history not pertinant except as noted below, Social history, Allergies, and medications have been entered into the medical record, reviewed, and corrections made.   Review of Systems:  See HPI for pertinent positives and negatives.  Objective:    General: Speaking clearly in complete sentences without any shortness of breath.  Alert and oriented x3.  Normal judgment. No apparent acute  distress.   Impression and Recommendations:    1. Allergy to environmental factors Symptoms and presentation consistent with allergy exacerbation due to seasonal environmental factors. Chart review reveals he has been successfully treated with albuterol, Symbicort, and Singulair in the past. Patient provided with prescriptions for albuterol, Symbicort, and Singulair with instructions on how to take each medication. Patient instructed to continue taking medications through the allergy season.  He may attempt to taper off them in a few months however he was instructed if symptoms return he may continue to take the medications daily.  Patient also instructed that he may utilize these medications on a seasonal basis in the future to help with prevention of allergy symptoms.  Suggested that if his allergies are only significant in the spring that he start taking her medications in February to help prevent exacerbation. Plan to follow-up if symptoms worsen or fail to improve. - albuterol (VENTOLIN HFA) 108 (90 Base) MCG/ACT inhaler; Inhale 1-2 puffs into the lungs every 6 (six) hours as needed for wheezing.  Dispense: 8 g; Refill: 3 - budesonide-formoterol (SYMBICORT) 160-4.5 MCG/ACT inhaler; Inhale 2 puffs into the lungs 2 (two) times daily.  Dispense: 1 Inhaler; Refill: 3 - montelukast (SINGULAIR) 10 MG tablet; Take 1 tablet (10 mg total) by mouth at bedtime.  Dispense: 30 tablet; Refill: 3  2. Bronchitis, allergic, unspecified asthma severity, with acute exacerbation Symptoms and presentation consistent with bronchitis brought on by seasonal allergy symptoms.  The patient does not have a diagnosis of asthma however I do feel he would benefit from a spirometry evaluation. Prescriptions for albuterol, Symbicort, and Singulair provided for the patient with instructions on  how to properly take the medication. At this time plan to continue these medications throughout the allergy season to help reduce the  risk of allergy symptoms and recurrence.  Patient was instructed that he may continue these medications throughout the year if his symptoms persist.  He was also instructed if symptoms are only seasonal he may begin the approximately 1 month before the allergy season to help prevent any exacerbation. - albuterol (VENTOLIN HFA) 108 (90 Base) MCG/ACT inhaler; Inhale 1-2 puffs into the lungs every 6 (six) hours as needed for wheezing.  Dispense: 8 g; Refill: 3 - budesonide-formoterol (SYMBICORT) 160-4.5 MCG/ACT inhaler; Inhale 2 puffs into the lungs 2 (two) times daily.  Dispense: 1 Inhaler; Refill: 3 - montelukast (SINGULAIR) 10 MG tablet; Take 1 tablet (10 mg total) by mouth at bedtime.  Dispense: 30 tablet; Refill: 3  3. Screening for HIV (human immunodeficiency virus) Patient due for one-time HIV screening.  Order placed. - HIV Antibody (routine testing w rflx)     I discussed the assessment and treatment plan with the patient. The patient was provided an opportunity to ask questions and all were answered. The patient agreed with the plan and demonstrated an understanding of the instructions.   The patient was advised to call back or seek an in-person evaluation if the symptoms worsen or if the condition fails to improve as anticipated.  I provided 11 minutes of non-face-to-face interaction with this MYCHART visit.   Tollie Eth, NP

## 2020-04-15 NOTE — Patient Instructions (Signed)
Allergies, Adult An allergy is when your body's defense system (immune system) overreacts to an otherwise harmless substance (allergen) that you breathe in or eat or something that touches your skin. When you come into contact with something that you are allergic to, your immune system produces certain proteins (antibodies). These proteins cause cells to release chemicals (histamines) that trigger the symptoms of an allergic reaction. Allergies often affect the nasal passages (allergic rhinitis), eyes (allergic conjunctivitis), skin (atopic dermatitis), and stomach. Allergies can be mild or severe. Allergies cannot spread from person to person (are not contagious). They can develop at any age and may be outgrown. What increases the risk? You may be at greater risk of allergies if other people in your family have allergies. What are the signs or symptoms? Symptoms depend on what type of allergy you have. They may include:  Runny, stuffy nose.  Sneezing.  Itchy mouth, ears, or throat.  Postnasal drip.  Sore throat.  Itchy, red, watery, or puffy eyes.  Skin rash or hives.  Stomach pain.  Vomiting.  Diarrhea.  Bloating.  Wheezing or coughing. People with a severe allergy to food, medicine, or an insect bite may have a life-threatening allergic reaction (anaphylaxis). Symptoms of anaphylaxis include:  Hives.  Itching.  Flushed face.  Swollen lips, tongue, or mouth.  Tight or swollen throat.  Chest pain or tightness in the chest.  Trouble breathing or shortness of breath.  Rapid heartbeat.  Dizziness or fainting.  Vomiting.  Diarrhea.  Pain in the abdomen. How is this diagnosed? This condition is diagnosed based on:  Your symptoms.  Your family and medical history.  A physical exam. You may need to see a health care provider who specializes in treating allergies (allergist). You may also have tests, including:  Skin tests to see which allergens are causing  your symptoms, such as: ? Skin prick test. In this test, your skin is pricked with a tiny needle and exposed to small amounts of possible allergens to see if your skin reacts. ? Intradermal skin test. In this test, a small amount of allergen is injected under your skin to see if your skin reacts. ? Patch test. In this test, a small amount of allergen is placed on your skin and then your skin is covered with a bandage. Your health care provider will check your skin after a couple of days to see if a rash has developed.  Blood tests.  Challenges tests. In this test, you inhale a small amount of allergen by mouth to see if you have an allergic reaction. You may also be asked to:  Keep a food diary. A food diary is a record of all the foods and drinks you have in a day and any symptoms you experience.  Practice an elimination diet. An elimination diet involves eliminating specific foods from your diet and then adding them back in one by one to find out if a certain food causes an allergic reaction. How is this treated? Treatment for allergies depends on your symptoms. Treatment may include:  Cold compresses to soothe itching and swelling.  Eye drops.  Nasal sprays.  Using a saline spray or container (neti pot) to flush out the nose (nasal irrigation). These methods can help clear away mucus and keep the nasal passages moist.  Using a humidifier.  Oral antihistamines or other medicines to block allergic reaction and inflammation.  Skin creams to treat rashes or itching.  Diet changes to eliminate food allergy triggers.    Repeated exposure to tiny amounts of allergens to build up a tolerance and prevent future allergic reactions (immunotherapy). These include: ? Allergy shots. ? Oral treatment. This involves taking small doses of an allergen under the tongue (sublingual immunotherapy).  Emergency epinephrine injection (auto-injector) in case of an allergic emergency. This is a  self-injectable, pre-measured medicine that must be given within the first few minutes of a serious allergic reaction. Follow these instructions at home:         Avoid known allergens whenever possible.  If you suffer from airborne allergens, wash out your nose daily. You can do this with a saline spray or a neti pot to flush out your nose (nasal irrigation).  Take over-the-counter and prescription medicines only as told by your health care provider.  Keep all follow-up visits as told by your health care provider. This is important.  If you are at risk of a severe allergic reaction (anaphylaxis), keep your auto-injector with you at all times.  If you have ever had anaphylaxis, wear a medical alert bracelet or necklace that states you have a severe allergy. Contact a health care provider if:  Your symptoms do not improve with treatment. Get help right away if:  You have symptoms of anaphylaxis, such as: ? Swollen mouth, tongue, or throat. ? Pain or tightness in your chest. ? Trouble breathing or shortness of breath. ? Dizziness or fainting. ? Severe abdominal pain, vomiting, or diarrhea. This information is not intended to replace advice given to you by your health care provider. Make sure you discuss any questions you have with your health care provider. Document Revised: 02/27/2018 Document Reviewed: 06/21/2016 Elsevier Patient Education  [place].   Acute Bronchitis, Adult  Acute bronchitis is sudden or acute swelling of the air tubes (bronchi) in the lungs. Acute bronchitis causes these tubes to fill with mucus, which can make it hard to breathe. It can also cause coughing or wheezing. In adults, acute bronchitis usually goes away within 2 weeks. A cough caused by bronchitis may last up to 3 weeks. Smoking, allergies, and asthma can make the condition worse. What are the causes? This condition can be caused by germs and by substances that irritate the lungs,  including:  Cold and flu viruses. The most common cause of this condition is the virus that causes the common cold.  Bacteria.  Substances that irritate the lungs, including: ? Smoke from cigarettes and other forms of tobacco. ? Dust and pollen. ? Fumes from chemical products, gases, or burned fuel. ? Other materials that pollute indoor or outdoor air.  Close contact with someone who has acute bronchitis. What increases the risk? The following factors may make you more likely to develop this condition:  A weak body's defense system, also called the immune system.  A condition that affects your lungs and breathing, such as asthma. What are the signs or symptoms? Common symptoms of this condition include:  Lung and breathing problems, such as: ? Coughing. This may bring up clear, yellow, or green mucus from your lungs (sputum). ? Wheezing. ? Having too much mucus in your lungs (chest congestion). ? Having shortness of breath.  A fever.  Chills.  Aches and pains, including: ? Tightness in your chest and other body aches. ? A sore throat. How is this diagnosed? This condition is usually diagnosed based on:  Your symptoms and medical history.  A physical exam. You may also have other tests, including tests to rule out other conditions,  such pneumonia. These tests include:  A test of lung function.  Test of a mucus sample to look for the presence of bacteria.  Tests to check the oxygen level in your blood.  Blood tests.  Chest X-ray. How is this treated? Most cases of acute bronchitis clear up over time without treatment. Your health care provider may recommend:  Drinking more fluids. This can thin your mucus, which may improve your breathing.  Taking a medicine for a fever or cough.  Using a device that gets medicine into your lungs (inhaler) to help improve breathing and control coughing.  Using a vaporizer or a humidifier. These are machines that add water to  the air to help you breathe better. Follow these instructions at home: Activity  Get plenty of rest.  Return to your normal activities as told by your health care provider. Ask your health care provider what activities are safe for you. Lifestyle  Drink enough fluid to keep your urine pale yellow.  Do not drink alcohol.  Do not use any products that contain nicotine or tobacco, such as cigarettes, e-cigarettes, and chewing tobacco. If you need help quitting, ask your health care provider. Be aware that: ? Your bronchitis will get worse if you smoke or breathe in other people's smoke (secondhand smoke). ? Your lungs will heal faster if you quit smoking. General instructions   Take over-the-counter and prescription medicines only as told by your health care provider.  Use an inhaler, vaporizer, or humidifier as told by your health care provider.  If you have a sore throat, gargle with a salt-water mixture 3-4 times a day or as needed. To make a salt-water mixture, completely dissolve -1 tsp (3-6 g) of salt in 1 cup (237 mL) of warm water.  Keep all follow-up visits as told by your health care provider. This is important. How is this prevented? To lower your risk of getting this condition again:  Wash your hands often with soap and water. If soap and water are not available, use hand sanitizer.  Avoid contact with people who have cold symptoms.  Try not to touch your mouth, nose, or eyes with your hands.  Avoid places where there are fumes from chemicals. Breathing these fumes will make your condition worse.  Get the flu shot every year. Contact a health care provider if:  Your symptoms do not improve after 2 weeks of treatment.  You vomit more than once or twice.  You have symptoms of dehydration such as: ? Dark urine. ? Dry skin or eyes. ? Increased thirst. ? Headaches. ? Confusion. ? Muscle cramps. Get help right away if you:  Cough up blood.  Feel pain in your  chest.  Have severe shortness of breath.  Faint or keep feeling like you are going to faint.  Have a severe headache.  Have fever or chills that get worse. These symptoms may represent a serious problem that is an emergency. Do not wait to see if the symptoms will go away. Get medical help right away. Call your local emergency services (911 in the [place] Do not drive yourself to the hospital. Summary  Acute bronchitis is sudden (acute) inflammation of the air tubes (bronchi) between the windpipe and the lungs. In adults, acute bronchitis usually goes away within 2 weeks, although coughing may last 3 weeks or longer  Take over-the-counter and prescription medicines only as told by your health care provider.  Drink enough fluid to keep your urine pale yellow.  Contact a health care provider if your symptoms do not improve after 2 weeks of treatment.  Get help right away if you cough up blood, faint, or have chest pain or shortness of breath. This information is not intended to replace advice given to you by your health care provider. Make sure you discuss any questions you have with your health care provider. Document Revised: 08/18/2019 Document Reviewed: 06/27/2019 Elsevier Patient Education  2020 [employer].

## 2020-05-12 ENCOUNTER — Other Ambulatory Visit: Payer: Self-pay | Admitting: Neurology

## 2020-05-12 DIAGNOSIS — I10 Essential (primary) hypertension: Secondary | ICD-10-CM

## 2020-05-12 MED ORDER — VALSARTAN-HYDROCHLOROTHIAZIDE 160-25 MG PO TABS: 1.0000 | ORAL_TABLET | Freq: Every day | ORAL | 0 refills | Status: DC

## 2020-07-25 ENCOUNTER — Other Ambulatory Visit: Payer: Self-pay | Admitting: Nurse Practitioner

## 2020-07-25 DIAGNOSIS — J45901 Unspecified asthma with (acute) exacerbation: Secondary | ICD-10-CM

## 2020-07-25 DIAGNOSIS — Z9109 Other allergy status, other than to drugs and biological substances: Secondary | ICD-10-CM

## 2020-07-26 ENCOUNTER — Ambulatory Visit (INDEPENDENT_AMBULATORY_CARE_PROVIDER_SITE_OTHER): Payer: Managed Care, Other (non HMO) | Admitting: Nurse Practitioner

## 2020-07-26 ENCOUNTER — Encounter: Payer: Self-pay | Admitting: Nurse Practitioner

## 2020-07-26 VITALS — BP 141/85 | HR 84 | Temp 97.7°F | Ht 71.0 in | Wt 294.8 lb

## 2020-07-26 DIAGNOSIS — M722 Plantar fascial fibromatosis: Secondary | ICD-10-CM | POA: Diagnosis not present

## 2020-07-26 MED ORDER — MELOXICAM 15 MG PO TABS: 15.0000 mg | ORAL_TABLET | Freq: Every day | ORAL | 0 refills | Status: DC

## 2020-07-26 NOTE — Progress Notes (Signed)
Acute Office Visit  Subjective:    Patient ID: Dale Howard, male    DOB: 08/16/1991, 29 y.o.   MRN: [id number]  No chief complaint on file.   HPI Patient is in today for right foot pain that started suddenly while walking through the [place] on Sunday.  He denies known injury to the foot.  He reports the pain is worse with pressure and walking. He states the pain runs along the bottom of his foot with soreness along the heel.  He denies decreased range of motion.   FOOT PAIN Duration: days Involved foot: right Mechanism of injury: none Location:  Onset: sudden  Severity: severe  Quality:  sharp and pulling Frequency: with walking and standing Radiation: no Aggravating factors: weight bearing, walking, running and stairs  Alleviating factors: rest  Status: stable Treatments attempted: rest  Relief with NSAIDs?:  No NSAIDs Taken Weakness with weight bearing or walking: yes Morning stiffness: no Swelling: yes Redness: no Bruising: no Paresthesias / decreased sensation: no  Fevers:no   Past Medical History:  Diagnosis Date   Hypertension    Hypogonadism in male    Obesity     No past surgical history on file.  Family History  Problem Relation Age of Onset   Heart attack Maternal Grandmother    Hypertension Maternal Grandmother    Hyperlipidemia Maternal Grandmother     Social History   Socioeconomic History   Marital status: Single    Spouse name: Not on file   Number of children: Not on file   Years of education: Not on file   Highest education level: Not on file  Occupational History   Not on file  Tobacco Use   Smoking status: Former Smoker   Smokeless tobacco: Never Used  Substance and Sexual Activity   Alcohol use: Yes    Alcohol/week: 0.0 standard drinks   Drug use: No   Sexual activity: Yes  Other Topics Concern   Not on file  Social History Narrative   Not on file   Social Determinants of Health   Financial  Resource Strain:    Difficulty of Paying Living Expenses:   Food Insecurity:    Worried About Programme researcher, broadcasting/film/video in the Last Year:    Barista in the Last Year:   Transportation Needs:    Freight forwarder (Medical):    Lack of Transportation (Non-Medical):   Physical Activity:    Days of Exercise per Week:    Minutes of Exercise per Session:   Stress:    Feeling of Stress :   Social Connections:    Frequency of Communication with Friends and Family:    Frequency of Social Gatherings with Friends and Family:    Attends Religious Services:    Active Member of Clubs or Organizations:    Attends Engineer, structural:    Marital Status:   Intimate Partner Violence:    Fear of Current or Ex-Partner:    Emotionally Abused:    Physically Abused:    Sexually Abused:     Outpatient Medications Prior to Visit  Medication Sig Dispense Refill   albuterol (VENTOLIN HFA) 108 (90 Base) MCG/ACT inhaler INHALE 1 TO 2 PUFFS INTO THE LUNGS EVERY 6 HOURS AS NEEDED FOR WHEEZING 8 g 3   budesonide-formoterol (SYMBICORT) 160-4.5 MCG/ACT inhaler Inhale 2 puffs into the lungs 2 (two) times daily. 1 Inhaler 3   montelukast (SINGULAIR) 10 MG tablet Take 1 tablet (10 mg  total) by mouth at bedtime. 30 tablet 3   phentermine (ADIPEX-P) 37.5 MG tablet Take 37.5 mg by mouth every morning.     valsartan-hydrochlorothiazide (DIOVAN-HCT) 160-25 MG tablet Take 1 tablet by mouth daily. Needs appt 90 tablet 0   No facility-administered medications prior to visit.    Allergies  Allergen Reactions   Penicillins Other (See Comments)    Unknown, childhood      Objective:    Physical Exam Vitals and nursing note reviewed.  Constitutional:      Appearance: Normal appearance. He is obese.  HENT:     Head: Normocephalic.  Eyes:     Extraocular Movements: Extraocular movements intact.     Conjunctiva/sclera: Conjunctivae normal.     Pupils: Pupils are equal,  round, and reactive to light.  Cardiovascular:     Rate and Rhythm: Normal rate.  Pulmonary:     Effort: Pulmonary effort is normal.  Musculoskeletal:        General: Swelling, tenderness and signs of injury present.  Feet:     Comments: Pain noted along the plantar surface of the right foot with increased pain noted on dorsiflexion and when weight bearing.  Mild edema present.  Tenderness on palpation noted along the medial and lateral surface of the heel.   Skin:    General: Skin is warm and dry.     Capillary Refill: Capillary refill takes less than 2 seconds.  Neurological:     General: No focal deficit present.     Mental Status: He is alert and oriented to person, place, and time.  Psychiatric:        Mood and Affect: Mood normal.        Behavior: Behavior normal.        Thought Content: Thought content normal.        Judgment: Judgment normal.     There were no vitals taken for this visit. Wt Readings from Last 3 Encounters:  01/09/20 (!) 313 lb (142 kg)  05/28/19 (!) 332 lb (150.6 kg)  01/29/19 (!) 332 lb (150.6 kg)    Health Maintenance Due  Topic Date Due   Hepatitis C Screening  Never done   COVID-19 Vaccine (1) Never done   HIV Screening  Never done   INFLUENZA VACCINE  07/18/2020    There are no preventive care reminders to display for this patient.   Lab Results  Component Value Date   TSH 1.11 01/09/2020   Lab Results  Component Value Date   WBC 9.3 01/09/2020   HGB 17.1 01/09/2020   HCT 49.7 01/09/2020   MCV 88.3 01/09/2020   PLT 202 01/09/2020   Lab Results  Component Value Date   NA 136 01/09/2020   K 4.5 01/09/2020   CO2 28 01/09/2020   GLUCOSE 78 01/09/2020   BUN 16 01/09/2020   CREATININE 0.91 01/09/2020   BILITOT 0.7 01/09/2020   ALKPHOS 101 06/27/2016   AST 32 01/09/2020   ALT 43 01/09/2020   PROT 7.8 01/09/2020   ALBUMIN 4.4 06/27/2016   ALBUMIN 4.4 06/27/2016   CALCIUM 9.8 01/09/2020   Lab Results  Component Value  Date   CHOL 180 01/09/2020   Lab Results  Component Value Date   HDL 39 (L) 01/09/2020   Lab Results  Component Value Date   LDLCALC 123 (H) 01/09/2020   Lab Results  Component Value Date   TRIG 79 01/09/2020   Lab Results  Component Value Date   CHOLHDL  4.6 01/09/2020   No results found for: HGBA1C     Assessment & Plan:   1. Plantar fasciitis of right foot Symptoms and presentation consistent with plantar fasciitis of the right foot.  Discussed conservative management including taping of the affected foot and utilization of ice and gentle exercises to help with injury. Meloxicam provided for inflammation and pain.  Work note provided to allow for accommodations to sit if needed while working.   If not improved in 4 weeks with conservative treatment, follow-up with Dr. Karie Schwalbe in sports medicine for potential steroid injection.  - meloxicam (MOBIC) 15 MG tablet; Take 1 tablet (15 mg total) by mouth daily.  Dispense: 30 tablet; Refill: 0   Tollie Eth, NP

## 2020-07-26 NOTE — Patient Instructions (Addendum)
You may use the taping method on the attached paper to help relieve some of the pain in the foot when walking. Do not tape so tightly that the foot turns colors.   You may use a frozen bottle of water and roll the foot over the bottle to help with inflammation and pain.   Do the exercises on the handout three times a day.   If you are not better after 3 weeks, then come back and see Dr. Karie Schwalbe.

## 2020-08-07 ENCOUNTER — Other Ambulatory Visit: Payer: Self-pay | Admitting: Nurse Practitioner

## 2020-08-07 DIAGNOSIS — J45901 Unspecified asthma with (acute) exacerbation: Secondary | ICD-10-CM

## 2020-08-07 DIAGNOSIS — Z9109 Other allergy status, other than to drugs and biological substances: Secondary | ICD-10-CM

## 2020-08-09 ENCOUNTER — Other Ambulatory Visit: Payer: Self-pay | Admitting: Physician Assistant

## 2020-08-09 DIAGNOSIS — I10 Essential (primary) hypertension: Secondary | ICD-10-CM

## 2020-08-24 ENCOUNTER — Ambulatory Visit: Payer: Managed Care, Other (non HMO) | Admitting: Sports Medicine

## 2020-08-31 ENCOUNTER — Other Ambulatory Visit: Payer: Self-pay | Admitting: Nurse Practitioner

## 2020-08-31 DIAGNOSIS — M722 Plantar fascial fibromatosis: Secondary | ICD-10-CM

## 2020-09-05 ENCOUNTER — Other Ambulatory Visit: Payer: Self-pay | Admitting: Physician Assistant

## 2020-09-05 DIAGNOSIS — I10 Essential (primary) hypertension: Secondary | ICD-10-CM

## 2020-09-20 ENCOUNTER — Other Ambulatory Visit: Payer: Self-pay | Admitting: Physician Assistant

## 2020-09-20 DIAGNOSIS — I10 Essential (primary) hypertension: Secondary | ICD-10-CM

## 2020-09-21 ENCOUNTER — Other Ambulatory Visit: Payer: Self-pay

## 2020-09-21 ENCOUNTER — Encounter: Payer: Self-pay | Admitting: Physician Assistant

## 2020-09-21 ENCOUNTER — Ambulatory Visit (INDEPENDENT_AMBULATORY_CARE_PROVIDER_SITE_OTHER): Payer: Managed Care, Other (non HMO) | Admitting: Physician Assistant

## 2020-09-21 VITALS — BP 144/98 | HR 72 | Ht 71.0 in | Wt 287.0 lb

## 2020-09-21 DIAGNOSIS — E6609 Other obesity due to excess calories: Secondary | ICD-10-CM | POA: Diagnosis not present

## 2020-09-21 DIAGNOSIS — Z6838 Body mass index (BMI) 38.0-38.9, adult: Secondary | ICD-10-CM

## 2020-09-21 DIAGNOSIS — I10 Essential (primary) hypertension: Secondary | ICD-10-CM

## 2020-09-21 DIAGNOSIS — M7711 Lateral epicondylitis, right elbow: Secondary | ICD-10-CM | POA: Diagnosis not present

## 2020-09-21 DIAGNOSIS — Z0000 Encounter for general adult medical examination without abnormal findings: Secondary | ICD-10-CM | POA: Diagnosis not present

## 2020-09-21 LAB — COMPLETE METABOLIC PANEL WITH GFR
AG Ratio: 1.6 (calc) (ref 1.0–2.5)
ALT: 58 U/L — ABNORMAL HIGH (ref 9–46)
AST: 50 U/L — ABNORMAL HIGH (ref 10–40)
Albumin: 4.5 g/dL (ref 3.6–5.1)
Alkaline phosphatase (APISO): 80 U/L (ref 36–130)
BUN: 13 mg/dL (ref 7–25)
CO2: 31 mmol/L (ref 20–32)
Calcium: 9.9 mg/dL (ref 8.6–10.3)
Chloride: 102 mmol/L (ref 98–110)
Creat: 0.89 mg/dL (ref 0.60–1.35)
GFR, Est African American: 133 mL/min/{1.73_m2} (ref >=60)
GFR, Est Non African American: 115 mL/min/{1.73_m2} (ref >=60)
Globulin: 2.8 g/dL (calc) (ref 1.9–3.7)
Glucose, Bld: 91 mg/dL (ref 65–99)
Potassium: 4.7 mmol/L (ref 3.5–5.3)
Sodium: 139 mmol/L (ref 135–146)
Total Bilirubin: 0.7 mg/dL (ref 0.2–1.2)
Total Protein: 7.3 g/dL (ref 6.1–8.1)

## 2020-09-21 MED ORDER — VALSARTAN-HYDROCHLOROTHIAZIDE 160-25 MG PO TABS: 1.0000 | ORAL_TABLET | Freq: Every day | ORAL | 3 refills | Status: DC

## 2020-09-21 MED ORDER — PHENTERMINE HCL 37.5 MG PO TABS: 37.5000 mg | ORAL_TABLET | Freq: Every morning | ORAL | 0 refills | Status: DC

## 2020-09-21 MED ORDER — DICLOFENAC SODIUM 1 % EX GEL: 4.0000 g | Freq: Four times a day (QID) | CUTANEOUS | 1 refills | Status: DC

## 2020-09-21 NOTE — Progress Notes (Signed)
Liver enzymes are up from last check and kind of surprising since you have lost so much weight. Are you taking tylenol or drinking alcohol regularly?

## 2020-09-21 NOTE — Patient Instructions (Addendum)
Continue mobic.  Wear tennis elbow strap and avoid overuse.  Ice and can use diclofenac.   Tennis Elbow Rehab Ask your health care provider which exercises are safe for you. Do exercises exactly as told by your health care provider and adjust them as directed. It is normal to feel mild stretching, pulling, tightness, or discomfort as you do these exercises. Stop right away if you feel sudden pain or your pain gets worse. Do not begin these exercises until told by your health care provider. Stretching and range-of-motion exercises These exercises warm up your muscles and joints and improve the movement and flexibility of your elbow. These exercises also help to relieve pain, numbness, and tingling. Wrist flexion, assisted  1. Straighten your left / right elbow in front of you with your palm facing down toward the floor. ? If told by your health care provider, bend your left / right elbow to a 90-degree angle (right angle) at your side. 2. With your other hand, gently push over the back of your left / right hand so your fingers point toward the floor (flexion). Stop when you feel a gentle stretch on the back of your forearm. 3. Hold this position for __________ seconds. Repeat __________ times. Complete this exercise __________ times a day. Wrist extension, assisted  1. Straighten your left / right elbow in front of you with your palm facing up toward the ceiling. ? If told by your health care provider, bend your left / right elbow to a 90-degree angle (right angle) at your side. 2. With your other hand, gently pull your left / right hand and fingers toward the floor (extension). Stop when you feel a gentle stretch on the palm side of your forearm. 3. Hold this position for __________ seconds. Repeat __________ times. Complete this exercise __________ times a day. Assisted forearm rotation, supination 1. Sit or stand with your left / right elbow bent to a 90-degree angle (right angle) at your  side. 2. Using your uninjured hand, turn (rotate) your left / right palm up toward the ceiling (supination) until you feel a gentle stretch along the inside of your forearm. 3. Hold this position for __________ seconds. Repeat __________ times. Complete this exercise __________ times a day. Assisted forearm rotation, pronation 1. Sit or stand with your left / right elbow bent to a 90-degree angle (right angle) at your side. 2. Using your uninjured hand, rotate your left / right palm down toward the floor (pronation) until you feel a gentle stretch along the outside of your forearm. 3. Hold this position for __________ seconds. Repeat __________ times. Complete this exercise __________ times a day. Strengthening exercises These exercises build strength and endurance in your forearm and elbow. Endurance is the ability to use your muscles for a long time, even after they get tired. Radial deviation  1. Stand with a __________ weight or a hammer in your left / right hand. Or, sit while holding a rubber exercise band or tubing, with your left / right forearm supported on a table or countertop. ? If you are standing, position your forearm so that your thumb is facing forward. If you are sitting, position your forearm so that the thumb is facing the ceiling. This is the neutral position. 2. Raise your hand upward in front of you so your thumb moves toward the ceiling (radial deviation), or pull up on the rubber tubing. Keep your forearm and elbow still while you move your wrist only. 3. Hold this position  for __________ seconds. 4. Slowly return to the starting position. Repeat __________ times. Complete this exercise __________ times a day. Wrist extension, eccentric 1. Sit with your left / right forearm palm-down and supported on a table or other surface. Let your left / right wrist extend over the edge of the surface. 2. Hold a __________ weight or a piece of exercise band or tubing in your left /  right hand. ? If using a rubber exercise band or tubing, hold the other end of the tubing with your other hand. 3. Use your uninjured hand to move your left / right hand up toward the ceiling. 4. Take your uninjured hand away and slowly return to the starting position using only your left / right hand. Lowering your arm under tension is called eccentric extension. Repeat __________ times. Complete this exercise __________ times a day. Wrist extension Do not do this exercise if it causes pain at the outside of your elbow. Only do this exercise once instructed by your health care provider. 1. Sit with your left / right forearm supported on a table or other surface and your palm turned down toward the floor. Let your left / right wrist extend over the edge of the surface. 2. Hold a __________ weight or a piece of rubber exercise band or tubing. ? If you are using a rubber exercise band or tubing, hold the band or tubing in place with your other hand to provide resistance. 3. Slowly bend your wrist so your hand moves up toward the ceiling (extension). Move only your wrist, keeping your forearm and elbow still. 4. Hold this position for __________ seconds. 5. Slowly return to the starting position. Repeat __________ times. Complete this exercise __________ times a day. Forearm rotation, supination To do this exercise, you will need a lightweight hammer or rubber mallet. 1. Sit with your left / right forearm supported on a table or other surface. Bend your elbow to a 90-degree angle (right angle). Position your forearm so that your palm is facing down toward the floor, with your hand resting over the edge of the table. 2. Hold a hammer in your left / right hand. ? To make this exercise easier, hold the hammer near the head of the hammer. ? To make this exercise harder, hold the hammer near the end of the handle. 3. Without moving your wrist or elbow, slowly rotate your forearm so your palm faces up  toward the ceiling (supination). 4. Hold this position for __________ seconds. 5. Slowly return to the starting position. Repeat __________ times. Complete this exercise __________ times a day. Shoulder blade squeeze 1. Sit in a stable chair or stand with good posture. If you are sitting down, do not let your back touch the back of the chair. 2. Your arms should be at your sides with your elbows bent to a 90-degree angle (right angle). Position your forearms so that your thumbs are facing the ceiling (neutral position). 3. Without lifting your shoulders up, squeeze your shoulder blades tightly together. 4. Hold this position for __________ seconds. 5. Slowly release and return to the starting position. Repeat __________ times. Complete this exercise __________ times a day. This information is not intended to replace advice given to you by your health care provider. Make sure you discuss any questions you have with your health care provider. Document Revised: 03/27/2019 Document Reviewed: 01/28/2019 Elsevier Patient Education  2020 [employer].

## 2020-09-21 NOTE — Progress Notes (Signed)
Subjective:    Patient ID: Dale Howard, male    DOB: May 30, 1991, 29 y.o.   MRN: [id number]  HPI  Pt is a 29 yo obese male who presents to the clinic for CPE.   He is doing well with diet and exercise. He has lost 50lbs since jan 2021. He feels great. He is using 1/2 tablet of phentermine a few times a week. He is eating clean and working out 3-4 times a week. He has lots more energy.   Pt denies any CP, palpitations, headaches, or vision changes.   He is having some right elbow pain. No injury. He does do a lot of heavy lifting. Pain over lateral elbow and at times feels weak.     Review of Systems  All other systems reviewed and are negative.      Objective:   Physical Exam Vitals reviewed.  Constitutional:      Appearance: Normal appearance. He is obese.  HENT:     Head: Normocephalic.     Right Ear: Tympanic membrane normal.     Left Ear: Tympanic membrane normal.     Nose: Nose normal.     Mouth/Throat:     Mouth: Mucous membranes are moist.  Eyes:     Conjunctiva/sclera: Conjunctivae normal.  Cardiovascular:     Rate and Rhythm: Normal rate and regular rhythm.     Pulses: Normal pulses.     Heart sounds: Normal heart sounds.  Pulmonary:     Effort: Pulmonary effort is normal.     Breath sounds: Normal breath sounds.  Abdominal:     General: Bowel sounds are normal. There is no distension.     Palpations: Abdomen is soft.  Musculoskeletal:     Right lower leg: No edema.     Left lower leg: No edema.  Lymphadenopathy:     Cervical: No cervical adenopathy.  Neurological:     General: No focal deficit present.     Mental Status: He is alert.  Psychiatric:        Mood and Affect: Mood normal.    BP (!) 144/98   Pulse 72   Ht 5\' 11"  (1.803 m)   Wt 287 lb (130.2 kg)   SpO2 100%   BMI 40.03 kg/m   General Appearance:    Alert, cooperative, no distress, appears stated age  Head:    Normocephalic, without obvious abnormality, atraumatic  Eyes:     PERRL, conjunctiva/corneas clear, EOM's intact, fundi    benign, both eyes       Ears:    Normal TM's and external ear canals, both ears  Nose:   Nares normal, septum midline, mucosa normal, no drainage    or sinus tenderness  Throat:   Lips, mucosa, and tongue normal; teeth and gums normal  Neck:   Supple, symmetrical, trachea midline, no adenopathy;       thyroid:  No enlargement/tenderness/nodules; no carotid   bruit or JVD  Back:     Symmetric, no curvature, ROM normal, no CVA tenderness  Lungs:     Clear to auscultation bilaterally, respirations unlabored  Chest wall:    No tenderness or deformity  Heart:    Regular rate and rhythm, S1 and S2 normal, no murmur, rub   or gallop  Abdomen:     Soft, non-tender, bowel sounds active all four quadrants,    no masses, no organomegaly        Extremities:   Extremities normal, atraumatic, no  cyanosis or edema. Right lateral epicondyle tenderness to palpation. 5/5 strength.   Pulses:   2+ and symmetric all extremities  Skin:   Skin color, texture, turgor normal, no rashes or lesions  Lymph nodes:   Cervical, supraclavicular, and axillary nodes normal  Neurologic:   CNII-XII intact. Normal strength, sensation and reflexes      throughout         Assessment & Plan:  Marland KitchenMarland KitchenMalikiah was seen today for annual exam.  Diagnoses and all orders for this visit:  Routine physical examination -     COMPLETE METABOLIC PANEL WITH GFR  Hypertension goal BP (blood pressure) < 130/80 -     valsartan-hydrochlorothiazide (DIOVAN-HCT) 160-25 MG tablet; Take 1 tablet by mouth daily. -     COMPLETE METABOLIC PANEL WITH GFR  Right lateral epicondylitis -     diclofenac Sodium (VOLTAREN) 1 % GEL; Apply 4 g topically 4 (four) times daily. To affected joint.  Class 2 obesity due to excess calories without serious comorbidity with body mass index (BMI) of 38.0 to 38.9 in adult -     phentermine (ADIPEX-P) 37.5 MG tablet; Take 1 tablet (37.5 mg total) by mouth  every morning.   Marland Kitchen.Start a regular exercise program and make sure you are eating a healthy diet Try to eat 4 servings of dairy a day or take a calcium supplement (500mg  twice a day). covid vaccine completed.  Pt declined flu vaccine.  Fasting labs ordered.  BP not to goal. Refilled BP medication. He is actively losing weight. Recheck in 3-6 months. Check BP at home.  He does use phentermine 1/2 tablet a few times during the week to keep him to his goal. Needs refill.  Continue exercise.   Discussed tennis elbow. Get strap online. Avoid overuse. Diclofenac gel to use up to 4 times a day. Continue mobic. Ice when can. If not improving in 2-3 weeks see Dr. sports medicine.

## 2020-09-22 ENCOUNTER — Other Ambulatory Visit: Payer: Self-pay | Admitting: Neurology

## 2020-09-22 DIAGNOSIS — R748 Abnormal levels of other serum enzymes: Secondary | ICD-10-CM

## 2020-09-22 NOTE — Progress Notes (Signed)
Ok recheck in 2 weeks liver enzymes and will go from there. Are you taking any weight lifting protein? You could back if you are to see if that could be causing the elevation. At times fighting off a virus can cause temporary elevation.

## 2020-09-24 ENCOUNTER — Other Ambulatory Visit: Payer: Self-pay | Admitting: Physician Assistant

## 2020-09-24 DIAGNOSIS — I10 Essential (primary) hypertension: Secondary | ICD-10-CM

## 2020-09-28 ENCOUNTER — Encounter: Payer: Self-pay | Admitting: Physician Assistant

## 2020-09-28 DIAGNOSIS — M7711 Lateral epicondylitis, right elbow: Secondary | ICD-10-CM | POA: Insufficient documentation

## 2020-09-30 ENCOUNTER — Other Ambulatory Visit: Payer: Self-pay | Admitting: Nurse Practitioner

## 2020-09-30 DIAGNOSIS — M722 Plantar fascial fibromatosis: Secondary | ICD-10-CM

## 2021-01-10 ENCOUNTER — Encounter: Payer: Managed Care, Other (non HMO) | Admitting: Physician Assistant

## 2021-01-14 ENCOUNTER — Ambulatory Visit (INDEPENDENT_AMBULATORY_CARE_PROVIDER_SITE_OTHER): Payer: Managed Care, Other (non HMO) | Admitting: Physician Assistant

## 2021-01-14 ENCOUNTER — Other Ambulatory Visit: Payer: Self-pay

## 2021-01-14 ENCOUNTER — Encounter: Payer: Self-pay | Admitting: Physician Assistant

## 2021-01-14 VITALS — BP 140/84 | HR 83 | Ht 71.0 in | Wt 291.0 lb

## 2021-01-14 DIAGNOSIS — M79662 Pain in left lower leg: Secondary | ICD-10-CM | POA: Diagnosis not present

## 2021-01-14 MED ORDER — AMBULATORY NON FORMULARY MEDICATION: 0 refills | Status: DC

## 2021-01-14 NOTE — Patient Instructions (Signed)
Medial Head Gastrocnemius Tear Rehab Ask your health care provider which exercises are safe for you. Do exercises exactly as told by your health care provider and adjust them as directed. It is normal to feel mild stretching, pulling, tightness, or discomfort as you do these exercises. Stop right away if you feel sudden pain or your pain gets worse. Do not begin these exercises until told by your health care provider. Stretching and range-of-motion exercises These exercises warm up your muscles and joints and improve the movement and flexibility of your lower leg. These exercises also help to relieve pain and stiffness. Gastrocnemius stretch This exercise is also called a calf stretch. It stretches the muscles in the back of the lower leg (gastrocnemius). 1. Sit with your left / right leg extended. 2. Loop a belt or towel around the ball of your left / right foot. The ball of your foot is on the walking surface, right under your toes. 3. Hold both ends of the belt or towel. 4. Keep your left / right ankle and foot relaxed and keep your knee straight while you use the belt or towel to pull your foot and ankle toward you. Stop at the first point of resistance. 5. Hold this position for __________ seconds. Repeat __________ times. Complete this exercise __________ times a day.   Ankle alphabet 1. Sit with your left / right leg supported at the lower leg. ? Do not rest your foot on anything. ? Make sure your foot has room to move freely. 2. Think of your left / right foot as a paintbrush. ? Move your foot to trace each letter of the alphabet in the air. Keep your hip and knee still while you trace. ? Make the letters as large as you can without feeling discomfort. 3. Trace every letter of the alphabet. Repeat __________ times. Complete this exercise __________ times a day.   Strengthening exercises These exercises build strength and endurance in your lower leg. Endurance is the ability to use your  muscles for a long time, even after they get tired. Plantar flexion with band, seated 1. Sit on the floor with your left / right leg extended. 2. Loop a rubber exercise band or tube around the ball of your left / right foot. The ball of your foot is on the walking surface, right under your toes. The band or tube should be slightly tense when your foot is relaxed. If the band or tube slips, you can put on your shoe or put a washcloth between the band and your foot to help it stay in place. 3. While holding both ends of the band or tube, slowly point your toes downward, pushing them away from you (plantar flexion). 4. Hold this position for __________ seconds. 5. Slowly release the tension in the band or tube, controlling smoothly until your foot is back to the starting position. Repeat __________ times. Complete this exercise __________ times a day.   Plantar flexion, standing 1. Stand with your feet shoulder-width apart. 2. Place your hands on a wall or table to steady yourself as needed, but try not to use it for support. 3. Rise up on your toes (plantar flexion). 4. If this exercise is too easy, try these options: ? Shift your weight toward your left / right leg until you feel challenged. ? If told by your health care provider, stand on your left / right foot only. 5. Hold this position for __________ seconds. Repeat __________ times. Complete this exercise   __________ times a day.   Eccentric plantar flexion 1. Stand on the balls of your feet on the edge of a step. The ball of your foot is on the walking surface, right under your toes. ? Do not put your heels on the step. ? For balance, rest your hands on the wall or on a railing. Try not to lean on it for support. 2. Rise up onto the balls of your feet, using both legs to help. 3. Keeping your heels up, slowly shift all of your weight to your left / right foot and lift your other foot off the step. 4. Slowly lower your left / right heel so  it drops below the level of the step. Lowering your heel under tension is called eccentric plantar flexion. You will feel a slight stretch in your left / right calf. 5. Put your other foot back onto the step before returning to the start position. Repeat __________ times. Complete this exercise __________ times a day.   This information is not intended to replace advice given to you by your health care provider. Make sure you discuss any questions you have with your health care provider. Document Revised: 10/01/2019 Document Reviewed: 10/14/2018 Elsevier Patient Education  2021 [employer]  

## 2021-01-14 NOTE — Progress Notes (Addendum)
Acute Office Visit  Subjective:    Patient ID: Dale Howard, male    DOB: Feb 27, 1991, 30 y.o.   MRN: [id number]  Chief Complaint  Patient presents with  . Leg Pain    30 year old male presents with right sided posterior calf pain for past two weeks worsening in past 3 days.  He visited the ED on 1/27 following an acute worsening of the pain after landing from a jump, where acute musculoskeletal abnormalities were ruled out by [place] and XR of right lower leg. By ultrasound hematoma was visualized over his right medial gastrocnemius.  He stated his leg was swollen up and red, and it felt as though he had been struck behind the calf.  His pain is still moderate but much reduced.  The ED discharged him with NSAIDs, ACE wrap, elevation, and follow up for ortho which was cancelled since he had previously scheduled an apt with this office.  His pain is made worse with dorsiflexion and deep palpation of the area, and he describes it as initially a burning sensation.   He denies pallor, paresthesias in RLE.  He denies weakness in his calf muscles.        Leg Pain  Pertinent negatives include no numbness.    Past Medical History:  Diagnosis Date  . Hypertension   . Hypogonadism in male   . Obesity     History reviewed. No pertinent surgical history.  Family History  Problem Relation Age of Onset  . Heart attack Maternal Grandmother   . Hypertension Maternal Grandmother   . Hyperlipidemia Maternal Grandmother     Social History   Socioeconomic History  . Marital status: Single    Spouse name: Not on file  . Number of children: Not on file  . Years of education: Not on file  . Highest education level: Not on file  Occupational History  . Not on file  Tobacco Use  . Smoking status: Former Games developer  . Smokeless tobacco: Never Used  Substance and Sexual Activity  . Alcohol use: Yes    Alcohol/week: 0.0 standard drinks  . Drug use: No  . Sexual activity: Yes  Other Topics Concern   . Not on file  Social History Narrative  . Not on file   Social Determinants of Health   Financial Resource Strain: Not on file  Food Insecurity: Not on file  Transportation Needs: Not on file  Physical Activity: Not on file  Stress: Not on file  Social Connections: Not on file  Intimate Partner Violence: Not on file    Outpatient Medications Prior to Visit  Medication Sig Dispense Refill  . valsartan-hydrochlorothiazide (DIOVAN-HCT) 160-25 MG tablet Take 1 tablet by mouth daily. (Patient not taking: Reported on 01/14/2021) 90 tablet 3  . albuterol (VENTOLIN HFA) 108 (90 Base) MCG/ACT inhaler INHALE 1 TO 2 PUFFS INTO THE LUNGS EVERY 6 HOURS AS NEEDED FOR WHEEZING 8 g 3  . diclofenac Sodium (VOLTAREN) 1 % GEL Apply 4 g topically 4 (four) times daily. To affected joint. 100 g 1  . meloxicam (MOBIC) 15 MG tablet TAKE 1 TABLET(15 MG) BY MOUTH DAILY 30 tablet 2  . montelukast (SINGULAIR) 10 MG tablet TAKE 1 TABLET(10 MG) BY MOUTH AT BEDTIME 30 tablet 3  . phentermine (ADIPEX-P) 37.5 MG tablet Take 1 tablet (37.5 mg total) by mouth every morning. 90 tablet 0   No facility-administered medications prior to visit.    Allergies  Allergen Reactions  . Penicillins Other (  See Comments)    Unknown, childhood    Review of Systems  Constitutional: Positive for activity change. Negative for chills, fatigue and fever.  HENT: Negative for congestion, hearing loss, rhinorrhea, sinus pressure, sneezing and sore throat.   Eyes: Negative for redness and itching.  Respiratory: Negative for cough, chest tightness and shortness of breath.   Cardiovascular: Positive for leg swelling. Negative for chest pain and palpitations.  Gastrointestinal: Negative for constipation, diarrhea, nausea and vomiting.  Genitourinary: Negative for dysuria and urgency.  Musculoskeletal: Positive for gait problem and myalgias. Negative for arthralgias, back pain and joint swelling.  Skin: Positive for color change.  Negative for pallor, rash and wound.  Neurological: Negative for dizziness, tremors, weakness, light-headedness and numbness.  Psychiatric/Behavioral: Negative for agitation, confusion and dysphoric mood. The patient is not nervous/anxious.   All other systems reviewed and are negative.      Objective:    Physical Exam Constitutional:      General: He is not in acute distress.    Appearance: Normal appearance. He is not ill-appearing.  HENT:     Head: Normocephalic.  Eyes:     Extraocular Movements: Extraocular movements intact.     Conjunctiva/sclera: Conjunctivae normal.     Pupils: Pupils are equal, round, and reactive to light.  Cardiovascular:     Rate and Rhythm: Normal rate and regular rhythm.     Pulses: Normal pulses.     Heart sounds: Normal heart sounds.  Pulmonary:     Effort: Pulmonary effort is normal.     Breath sounds: Normal breath sounds.  Abdominal:     General: Abdomen is flat. There is no distension.  Musculoskeletal:        General: Swelling, tenderness and signs of injury present. No deformity.     Cervical back: Normal range of motion.     Right upper leg: Normal.     Left upper leg: Normal.     Right knee: Normal. No swelling.     Left knee: Normal. No swelling.     Right lower leg: Swelling and tenderness present. No deformity or bony tenderness. No edema.     Left lower leg: No swelling, deformity or tenderness. No edema.     Right ankle:     Right Achilles Tendon: Tenderness present. No defects. Thompson's test negative.     Left ankle: Normal.     Left Achilles Tendon: No tenderness or defects. Thompson's test negative.     Right foot: Normal. Normal capillary refill. No swelling or tenderness. Normal pulse.     Left foot: Normal. Normal capillary refill. No swelling or tenderness. Normal pulse.       Legs:     Comments: R calf circumference - 51.5 cm vs L calf circumference - 46.5 cm  Patient is hobbling on ambulation from room, staying off  of right leg as much as possible.     Skin:    General: Skin is warm and dry.     Capillary Refill: Capillary refill takes less than 2 seconds.     Coloration: Skin is not pale.     Findings: No bruising or lesion.  Neurological:     General: No focal deficit present.     Mental Status: He is alert and oriented to person, place, and time. Mental status is at baseline.     Sensory: No sensory deficit.     Motor: No weakness.  Psychiatric:        Mood and  Affect: Mood normal.        Behavior: Behavior normal.    2+pedal pulses, strong. Good capillary refill.   BP 140/84   Pulse 83   Ht 5\' 11"  (1.803 m)   Wt 291 lb (132 kg)   SpO2 100%   BMI 40.59 kg/m  Wt Readings from Last 3 Encounters:  01/14/21 291 lb (132 kg)  09/21/20 287 lb (130.2 kg)  07/26/20 294 lb 12.8 oz (133.7 kg)      Lab Results  Component Value Date   TSH 1.11 01/09/2020   Lab Results  Component Value Date   WBC 9.3 01/09/2020   HGB 17.1 01/09/2020   HCT 49.7 01/09/2020   MCV 88.3 01/09/2020   PLT 202 01/09/2020   Lab Results  Component Value Date   NA 139 09/21/2020   K 4.7 09/21/2020   CO2 31 09/21/2020   GLUCOSE 91 09/21/2020   BUN 13 09/21/2020   CREATININE 0.89 09/21/2020   BILITOT 0.7 09/21/2020   ALKPHOS 101 06/27/2016   AST 50 (H) 09/21/2020   ALT 58 (H) 09/21/2020   PROT 7.3 09/21/2020   ALBUMIN 4.4 06/27/2016   ALBUMIN 4.4 06/27/2016   CALCIUM 9.9 09/21/2020   Lab Results  Component Value Date   CHOL 180 01/09/2020   Lab Results  Component Value Date   HDL 39 (L) 01/09/2020   Lab Results  Component Value Date   LDLCALC 123 (H) 01/09/2020   Lab Results  Component Value Date   TRIG 79 01/09/2020   Lab Results  Component Value Date   CHOLHDL 4.6 01/09/2020   No results found for: HGBA1C     Assessment & Plan:  01/24/2021Marland KitchenZaim was seen today for leg pain.  Diagnoses and all orders for this visit:  Pain of left calf -     AMBULATORY NON FORMULARY MEDICATION; One  pair of adjustable crutches. -     Ambulatory referral to Orthopedic Surgery    Right medial calf pain:  Suspect gastrocnemius strain vs tear. Patient to be referred for Orthopedics follow-up to consider MRI. He did have hematoma and evident right calf is much larger than left on PE. Negative for blood clot in ED. His pain is improving overall. Less likely compartment syndrome since pain is improving. Patient educated about conservative measures including RICE, Ace wrap, rehabilitative exercises.  Encouraged to remain off foot as much as possible.  Patient can continue to use NSAIDs, biofreeze for pain.  Red flag symptoms for compartment syndrome explained to patient.  Note provided to recommend he be kept out of work between 1/31 and 2/4 until he can be assessed and managed by Ortho.  Crutches provided to patient for extended ambulation. Follow up sooner if pain worsening or numbness and tingling in feet.     2/31Marland Kitchen PA-C, have reviewed and agree with the above documentation in it's entirety.

## 2021-01-17 ENCOUNTER — Ambulatory Visit (INDEPENDENT_AMBULATORY_CARE_PROVIDER_SITE_OTHER): Payer: Managed Care, Other (non HMO) | Admitting: Physician Assistant

## 2021-01-17 ENCOUNTER — Encounter: Payer: Self-pay | Admitting: Physician Assistant

## 2021-01-17 DIAGNOSIS — S86811S Strain of other muscle(s) and tendon(s) at lower leg level, right leg, sequela: Secondary | ICD-10-CM

## 2021-01-17 MED ORDER — HYDROCODONE-ACETAMINOPHEN 5-325 MG PO TABS: 1.0000 | ORAL_TABLET | Freq: Four times a day (QID) | ORAL | 0 refills | Status: DC | PRN

## 2021-01-17 NOTE — Progress Notes (Signed)
Office Visit Note   Patient: Dale Howard           Date of Birth: 1991/06/11           MRN: [id number] Visit Date: 01/17/2021              Requested by: Dale Howard [address] [place],  [place] [address] PCP: Dale Howard   Assessment & Plan: Visit Diagnoses:  1. Strain of calf muscle, right, sequela     Plan: Discussed with him that the findings are consistent with a gastrocsoleus strain. Recommend conservative treatment. 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 He does state that sedentary work may be available in the future. We will place him in a cam walker boot weightbearing as tolerated right lower extremity. Able to come out of the cam walker boot when sleeping and for hygiene purposes. He is placed out of work for the next 2 weeks. Reevaluate his work status in 2 weeks. Elevation wiggling toes encouraged. Encouraged heat to the calf.  Follow-Up Instructions: Return in about 2 weeks (around 01/31/2021).   Orders:  No orders of the defined types were placed in this encounter.  Meds ordered this encounter  Medications  . HYDROcodone-acetaminophen (NORCO/VICODIN) 5-325 MG tablet    Sig: Take 1 tablet by mouth every 6 (six) hours as needed for moderate pain.    Dispense:  30 tablet    Refill:  0      Procedures: No procedures performed   Clinical Data: No additional findings.   Subjective: Chief Complaint  Patient presents with  . Right Leg - Pain    HPI Dale Howard is a 30 year old male were seen for the first time for right calf pain. Went to the ER on 1227 due to pain in his calf. He did undergo an ultrasound which I reviewed this showed no evidence of DVT. However there was a heterogeneous fluid collection in the mid calf region consistent with a hematoma. He reports that a week ago he took a step had some soreness and tightness in the calf. Last Thursday he was popped on  his leg and had pain in his calf and a cramp he is started massaging this area it began to swell. No particular injury. He is currently using crutches to ambulate. Results from right tib-fib radiographs obtained on 01/13/2021 are reviewed and is read as no acute fractures or malalignment involving the tibia and fibula. Images are not available.  Review of Systems  Constitutional: Negative for chills and fever.  Musculoskeletal: Positive for arthralgias.     Objective: Vital Signs: There were no vitals taken for this visit.  Physical Exam General: Well-developed well-nourished male no acute distress. Ambulates using 1 crutch and partial weightbearing on the right lower extremity. Ortho Exam Bilateral lower extremities he has slight edema of the right calf compared to left. Calves are supple he has tenderness in the right calf. Thompson's test is negative bilaterally. No tenderness over the Achilles bilaterally. Dorsal pedal pulses are 2+ bilaterally. Is able dorsiflex plantarflex bilateral ankles. Specialty Comments:  No specialty comments available.  Imaging: No results found.   PMFS History: Patient Active Problem List   Diagnosis Date Noted  . Right lateral epicondylitis 09/28/2020  . Bronchospasm 04/24/2019  . Hypertension goal BP (blood pressure) < 130/80 04/24/2019  . Allergic rhinitis with postnasal drip 04/24/2019  .  ACE-inhibitor cough 04/24/2019  . Persistent cough for 3 weeks or longer 01/03/2018  . Acute cervical sprain 01/03/2018  . Low back pain radiating to left leg 02/06/2017  . Drug-induced erectile dysfunction 10/13/2016  . Morbid obesity (HCC) 06/28/2016  . Male hypogonadism 06/28/2016  . Abnormal weight gain 06/28/2016  . Essential hypertension, benign 06/05/2016  . Elevated hemoglobin (HCC) 06/05/2016  . Low testosterone 05/19/2016  . Elevated ALT measurement 05/19/2016  . Vitamin D deficiency 05/19/2016  . Closed displaced fracture of proximal phalanx of  right middle finger 05/18/2016  . Lumbar disc herniation 05/05/2016  . Other fatigue 05/05/2016   Past Medical History:  Diagnosis Date  . Hypertension   . Hypogonadism in male   . Obesity     Family History  Problem Relation Age of Onset  . Heart attack Maternal Grandmother   . Hypertension Maternal Grandmother   . Hyperlipidemia Maternal Grandmother     History reviewed. No pertinent surgical history. Social History   Occupational History  . Not on file  Tobacco Use  . Smoking status: Former Games developer  . Smokeless tobacco: Never Used  Substance and Sexual Activity  . Alcohol use: Yes    Alcohol/week: 0.0 standard drinks  . Drug use: No  . Sexual activity: Yes

## 2021-01-31 ENCOUNTER — Ambulatory Visit (INDEPENDENT_AMBULATORY_CARE_PROVIDER_SITE_OTHER): Payer: Managed Care, Other (non HMO) | Admitting: Physician Assistant

## 2021-01-31 ENCOUNTER — Encounter: Payer: Self-pay | Admitting: Physician Assistant

## 2021-01-31 DIAGNOSIS — S86811S Strain of other muscle(s) and tendon(s) at lower leg level, right leg, sequela: Secondary | ICD-10-CM | POA: Diagnosis not present

## 2021-01-31 NOTE — Progress Notes (Signed)
HPI: Dale Howard returns today follow-up of his right calf strain.  He states is overall improving.  States the boot was actually causing more pain movement helping.  The has stopped wearing it at this point.  He has had no new injury to his right calf.  He remains out of work.  Review of systems: See HPI otherwise negative  Physical exam: General well-developed well-nourished male no acute distress.  Walks without an assistive device slight antalgic gait.  Right lower extremity: Calf supple tenderness over the distal gastroc region.  There is no ecchymosis.  Achilles is nontender and intact.  Dorsiflexion plantarflexion right ankle intact.  Impression: Resolving right calf strain.  Plan: Given the 9/16 " heel lifts to place in both shoes.  He will work on stretching.  No high impact activities.  We will keep him out of work for the next 2 weeks.  Follow-up with [place] in 2 weeks to reevaluate his work status and see how he is progressing.  Questions were encouraged and answered at length

## 2021-02-04 ENCOUNTER — Telehealth: Payer: Self-pay | Admitting: Physician Assistant

## 2021-02-04 NOTE — Telephone Encounter (Signed)
Guardian forms received. Sent to [employer]. 

## 2021-02-14 ENCOUNTER — Ambulatory Visit (INDEPENDENT_AMBULATORY_CARE_PROVIDER_SITE_OTHER): Payer: Managed Care, Other (non HMO) | Admitting: Physician Assistant

## 2021-02-14 ENCOUNTER — Encounter: Payer: Self-pay | Admitting: Physician Assistant

## 2021-02-14 VITALS — Ht 71.0 in | Wt 291.0 lb

## 2021-02-14 DIAGNOSIS — S86811S Strain of other muscle(s) and tendon(s) at lower leg level, right leg, sequela: Secondary | ICD-10-CM | POA: Diagnosis not present

## 2021-02-14 NOTE — Progress Notes (Signed)
HPI: Dale Howard returns today follow-up of his right calf strain.  He states he feels much better.  Still feels tightness whenever he walks for an extended period time.  He is using 916 as he lives.  He has had no new injury.  Review of systems see HPI otherwise negative.  Physical exam: General well-developed well-nourished male no acute distress. Right lower extremity.  Calf is nontender.  Dorsiflexion plantarflexion right ankle intact.  Ambulates without any assistive device.  Impression: Resolving right calf strain  Plan: We will have him do reduced hours 8 hours a day for the next 2 weeks and then resume his normal hours.  Reminded him to continue to work on gastrocsoleus stretching exercises.  Continue wearing the 916's inch heel lifts for the next month and then he can transition out of these as tolerated.  He will follow up with [place] as needed.  Questions encouraged and answered

## 2021-03-22 ENCOUNTER — Ambulatory Visit (INDEPENDENT_AMBULATORY_CARE_PROVIDER_SITE_OTHER): Payer: Managed Care, Other (non HMO) | Admitting: Physician Assistant

## 2021-03-22 DIAGNOSIS — Z5329 Procedure and treatment not carried out because of patient's decision for other reasons: Secondary | ICD-10-CM

## 2021-03-22 NOTE — Progress Notes (Signed)
No show

## 2021-05-11 ENCOUNTER — Ambulatory Visit (INDEPENDENT_AMBULATORY_CARE_PROVIDER_SITE_OTHER): Payer: Managed Care, Other (non HMO) | Admitting: Physician Assistant

## 2021-05-11 ENCOUNTER — Encounter: Payer: Self-pay | Admitting: Physician Assistant

## 2021-05-11 ENCOUNTER — Other Ambulatory Visit: Payer: Self-pay

## 2021-05-11 VITALS — BP 171/116 | HR 79 | Ht 71.0 in | Wt 305.0 lb

## 2021-05-11 DIAGNOSIS — G478 Other sleep disorders: Secondary | ICD-10-CM

## 2021-05-11 DIAGNOSIS — R4589 Other symptoms and signs involving emotional state: Secondary | ICD-10-CM

## 2021-05-11 DIAGNOSIS — Z6841 Body Mass Index (BMI) 40.0 and over, adult: Secondary | ICD-10-CM

## 2021-05-11 DIAGNOSIS — E66813 Obesity, class 3: Secondary | ICD-10-CM

## 2021-05-11 DIAGNOSIS — R7989 Other specified abnormal findings of blood chemistry: Secondary | ICD-10-CM | POA: Diagnosis not present

## 2021-05-11 DIAGNOSIS — R5383 Other fatigue: Secondary | ICD-10-CM | POA: Diagnosis not present

## 2021-05-11 DIAGNOSIS — M778 Other enthesopathies, not elsewhere classified: Secondary | ICD-10-CM

## 2021-05-11 DIAGNOSIS — Z0000 Encounter for general adult medical examination without abnormal findings: Secondary | ICD-10-CM | POA: Diagnosis not present

## 2021-05-11 MED ORDER — BUPROPION HCL ER (SR) 150 MG PO TB12: 150.0000 mg | ORAL_TABLET | Freq: Two times a day (BID) | ORAL | 2 refills | Status: DC

## 2021-05-11 NOTE — Patient Instructions (Addendum)
Extensor Carpi Ulnaris Tendinitis Tendinitis is inflammation in the tissues that connect muscles to bone (tendons). The extensor carpi ulnaris (ECU) tendon is located on the back of the wrist, on the side that is closest to the small finger. ECU tendinitis will cause pain on the small finger side of the wrist or forearm. It is a common sports-related injury. What are the causes? This condition may be caused by:  Putting repeated stress on your wrist.  Using the wrong technique while playing sports like golf or tennis.  Weakening of the tendon due to age or a health problem.  Injury or trauma. What increases the risk? You are more likely to develop this condition if:  You play sports that involve repeated motions of the wrist, such as golf, tennis, or rugby.  You are 31 years of age or older.  You have an inflammatory condition, such as rheumatoid arthritis. What are the signs or symptoms? Symptoms of this condition include:  Pain along the small finger side of your wrist or forearm when moving your wrist.  A constant ache on the small finger side of your wrist.  Feeling like your grip is weaker than usual.  A popping, snapping, or tearing feeling in your wrist.  Wrist swelling. How is this diagnosed? This condition may be diagnosed based on:  Your symptoms and medical history.  A physical exam. During the exam, your health care provider will check the strength of your grip and may ask you to move your wrist. Your health care provider may also order an MRI or ultrasound to check for tears in your ligaments, muscles, or tendons. How is this treated? Treatment for this condition may include:  Resting your wrist.  Wearing a splint on your wrist.  Medicines to help with pain and swelling.  Applying heat or ice to the area to ease pain.  Exercises to help you maintain strength and range of motion in your wrist (physical therapy).  Therapy to help with everyday activities  (occupational therapy). If these treatments do not help, you may need an injection of an anti-inflammatory medicine (steroid) mixed with a numbing medicine (local anesthetic). Follow these instructions at home: If you have a splint:  Wear it as told by your health care provider. Remove it only as told by your health care provider.  Loosen the splint if your fingers tingle, become numb, or turn cold and blue.  Keep the splint clean.  If the splint is not waterproof: ? Do not let it get wet. ? Cover it with a watertight covering when you take a bath or shower. ? Remove it for bathing and washing your hand only if instructed by your health care provider.   Managing pain, stiffness, and swelling  If directed, put ice on the injured area. ? If you have a removable splint, remove it as told by your health care provider. ? Put ice in a plastic bag. ? Place a towel between your skin and the bag. ? Leave the ice on for 20 minutes, 2-3 times a day.  If directed, apply heat to the affected area as often as told by your health care provider. Use the heat source that your health care provider recommends, such as a moist heat pack or a heating pad. ? If you have a removable splint, remove it as told by your health care provider. ? Place a towel between your skin and the heat source. ? Leave the heat on for 15-20 minutes. ? Remove  the heat if your skin turns bright red. This is especially important if you are unable to feel pain, heat, or cold. You may have a greater risk of getting burned.  Move your fingers often to reduce stiffness and swelling.  Raise (elevate) the injured area above the level of your heart while you are sitting or lying down.      Activity  Avoid activities that cause pain or put strain on your wrist for as long as told by your health care provider.  Do not use your injured hand to support your body weight until your health care provider says that you can.  Return to  your normal activities as told by your health care provider. Ask your health care provider what activities are safe for you.  Do exercises as told by your health care provider.  Ask your health care provider when it is safe to drive if you have a splint on your wrist. General instructions  Take over-the-counter and prescription medicines only as told by your health care provider.  Do not use any products that contain nicotine or tobacco, such as cigarettes, e-cigarettes, and chewing tobacco. These can delay healing. If you need help quitting, ask your health care provider.  Keep all follow-up visits as told by your health care provider. This is important. How is this prevented?  Warm up and stretch before being active.  Cool down and stretch after being active.  Give your body time to rest between periods of activity.  Use equipment that fits you and is in good condition.  If you play golf or racket sports, focus on proper form or try to improve your technique.  Maintain physical fitness, including: ? Strength. ? Flexibility. ? Endurance. Contact a health care provider if:  Your pain, tenderness, or swelling gets worse, even if you have had treatment. Get help right away if:  Your pain becomes severe.  You cannot move your wrist. Summary  Extensor carpi ulnaris tendinitis is a condition that will cause pain on the small finger side of the wrist or forearm.  Treatment for this condition may include wearing a splint, taking pain medicines, applying heat or ice, and physical or occupational therapy.  Avoid activities that cause pain or put strain on your wrist for as long as told by your health care provider.  Return to your normal activities as told by your health care provider. This information is not intended to replace advice given to you by your health care provider. Make sure you discuss any questions you have with your health care provider. Document Revised:  04/01/2019 Document Reviewed: 01/23/2019 Elsevier Patient Education  2021 [employer]   Health Maintenance, Male Adopting a healthy lifestyle and getting preventive care are important in promoting health and wellness. Ask your health care provider about:  The right schedule for you to have regular tests and exams.  Things you can do on your own to prevent diseases and keep yourself healthy. What should I know about diet, weight, and exercise? Eat a healthy diet  Eat a diet that includes plenty of vegetables, fruits, low-fat dairy products, and lean protein.  Do not eat a lot of foods that are high in solid fats, added sugars, or sodium.   Maintain a healthy weight Body mass index (BMI) is a measurement that can be used to identify possible weight problems. It estimates body fat based on height and weight. Your health care provider can help determine your BMI and help you  achieve or maintain a healthy weight. Get regular exercise Get regular exercise. This is one of the most important things you can do for your health. Most adults should:  Exercise for at least 150 minutes each week. The exercise should increase your heart rate and make you sweat (moderate-intensity exercise).  Do strengthening exercises at least twice a week. This is in addition to the moderate-intensity exercise.  Spend less time sitting. Even light physical activity can be beneficial. Watch cholesterol and blood lipids Have your blood tested for lipids and cholesterol at 31 years of age, then have this test every 5 years. You may need to have your cholesterol levels checked more often if:  Your lipid or cholesterol levels are high.  You are older than 31 years of age.  You are at high risk for heart disease. What should I know about cancer screening? Many types of cancers can be detected early and may often be prevented. Depending on your health history and family history, you may need to have cancer  screening at various ages. This may include screening for:  Colorectal cancer.  Prostate cancer.  Skin cancer.  Lung cancer. What should I know about heart disease, diabetes, and high blood pressure? Blood pressure and heart disease  High blood pressure causes heart disease and increases the risk of stroke. This is more likely to develop in people who have high blood pressure readings, are of African descent, or are overweight.  Talk with your health care provider about your target blood pressure readings.  Have your blood pressure checked: ? Every 3-5 years if you are 5918-31 years of age. ? Every year if you are 31 years old or older.  If you are between the ages of 7265 and 4375 and are a current or former smoker, ask your health care provider if you should have a one-time screening for abdominal aortic aneurysm (AAA). Diabetes Have regular diabetes screenings. This checks your fasting blood sugar level. Have the screening done:  Once every three years after age 31 if you are at a normal weight and have a low risk for diabetes.  More often and at a younger age if you are overweight or have a high risk for diabetes. What should I know about preventing infection? Hepatitis B If you have a higher risk for hepatitis B, you should be screened for this virus. Talk with your health care provider to find out if you are at risk for hepatitis B infection. Hepatitis C Blood testing is recommended for:  Everyone born from [id number] through 1966.  Anyone with known risk factors for hepatitis C. Sexually transmitted infections (STIs)  You should be screened each year for STIs, including gonorrhea and chlamydia, if: ? You are sexually active and are younger than 31 years of age. ? You are older than 31 years of age and your health care provider tells you that you are at risk for this type of infection. ? Your sexual activity has changed since you were last screened, and you are at increased risk  for chlamydia or gonorrhea. Ask your health care provider if you are at risk.  Ask your health care provider about whether you are at high risk for HIV. Your health care provider may recommend a prescription medicine to help prevent HIV infection. If you choose to take medicine to prevent HIV, you should first get tested for HIV. You should then be tested every 3 months for as long as you are taking the medicine.  Follow these instructions at home: Lifestyle  Do not use any products that contain nicotine or tobacco, such as cigarettes, e-cigarettes, and chewing tobacco. If you need help quitting, ask your health care provider.  Do not use street drugs.  Do not share needles.  Ask your health care provider for help if you need support or information about quitting drugs. Alcohol use  Do not drink alcohol if your health care provider tells you not to drink.  If you drink alcohol: ? Limit how much you have to 0-2 drinks a day. ? Be aware of how much alcohol is in your drink. In the [place] one drink equals one 12 oz bottle of beer (355 mL), one 5 oz glass of wine (148 mL), or one 1 oz glass of hard liquor (44 mL). General instructions  Schedule regular health, dental, and eye exams.  Stay current with your vaccines.  Tell your health care provider if: ? You often feel depressed. ? You have ever been abused or do not feel safe at home. Summary  Adopting a healthy lifestyle and getting preventive care are important in promoting health and wellness.  Follow your health care provider's instructions about healthy diet, exercising, and getting tested or screened for diseases.  Follow your health care provider's instructions on monitoring your cholesterol and blood pressure. This information is not intended to replace advice given to you by your health care provider. Make sure you discuss any questions you have with your health care provider. Document Revised: 11/27/2018 Document Reviewed:  11/27/2018 Elsevier Patient Education  2021 [employer]    Extensor [place] Ulnaris Tendinitis Rehab Ask your health care provider which exercises are safe for you. Do exercises exactly as told by your health care provider and adjust them as directed. It is normal to feel mild stretching, pulling, tightness, or discomfort as you do these exercises. Stop right away if you feel sudden pain or your pain gets worse. Do not begin these exercises until told by your health care provider. Stretching and range-of-motion exercises These exercises warm up your muscles and joints and improve the movement and flexibility of your forearm. The exercises also help to relieve pain, numbness, and tingling. Wrist extension 1. Stand or sit with your left / right elbow bent to a 90-degree angle (right angle) at your side, with your palm facing the floor. 2. Slowly lift your wrist up so that your fingers move toward the ceiling (extension), stopping when you feel a gentle stretch on the palm side of your forearm. 3. Hold this position for __________ seconds. 4. Return to the starting position. Repeat __________ times. Complete this exercise __________ times a day. Wrist flexion 1. Stand or sit with your left / right elbow bent to a 90-degree angle (right angle) at your side, with your palm facing the floor. 2. Slowly bend your wrist so that your fingers move toward the floor, stopping when you feel a gentle stretch over the back of your forearm. 3. Hold this position for __________ seconds. 4. Return to the starting position. Repeat __________ times. Complete this exercise __________ times a day. Forearm rotation, supination 1. Stand or sit with your left / right elbow bent to a 90-degree angle (right angle) at your side. Position your forearm so that the thumb is facing the ceiling (neutral position). 2. Turn (rotate) your palm up toward the ceiling (supination), stopping when you feel a gentle stretch. 3. Hold  this position for __________ seconds. 4. Return to the starting  position. Repeat __________ times. Complete this exercise __________ times a day. Forearm rotation, pronation 1. Stand or sit with your left / right elbow bent to a 90-degree angle (right angle) at your side. Position your forearm so that the thumb is facing the ceiling (neutral position). 2. Turn (rotate) your palm down toward the floor (pronation), stopping when you feel a gentle stretch. 3. Hold this position for __________ seconds. 4. Return to the starting position. Repeat __________ times. Complete this exercise __________ times a day. Wrist extension, assisted 1. Extend your left / right arm in front of you, and point your fingers downward. 2. With your uninjured hand, gently press the back of your left / right hand toward you until you feel a gentle stretch on the top of your forearm and wrist (extension). 3. Hold this position for __________ seconds. 4. Slowly return to the starting position. Repeat __________ times with your elbow straight and __________ times with your elbow bent. Complete this exercises __________ times a day. Wrist flexion, assisted 1. Stand over a tabletop with your left / right hand resting palm-up on the tabletop and with your fingers pointing away from your body. Your arm should be extended, and there should be a slight bend in your elbow. 2. Gently press the back of your hand down onto the table by straightening your elbow (flexion stretch). You should feel a stretch in the top of your forearm. 3. Hold this position for __________ seconds. 4. Slowly return to the starting position. Repeat __________ times. Complete this exercise __________ times a day.   Strengthening exercises These exercises build strength and endurance in your forearm. Endurance is the ability to use your muscles for a long time, even after they get tired. Wrist extension 1. Sit with your left / right forearm supported on a  table and your hand resting palm-down over the edge of the table. 2. Hold a __________ weight in your left / right hand, or hold a rubber exercise band or tube in both hands. If you are holding a band or tube, take up any slack with your other hand so there is a slight tension in the exercise band or tube when you start. 3. Slowly lift your wrist up toward the ceiling (extension). 4. Hold this position for __________ seconds. 5. Slowly lower your hand to the starting position. Repeat __________ times. Complete this exercise __________ times a day. Ulnar deviation 1. Sit with your left / right forearm supported on a table. Your thumb should be pointing upward, and your hand should be able to move down over the table edge. 2. Hold your left / right arm in front of you and hold a rubber exercise band or tube between your hands. There should be a slight tension in the exercise band or tube when you start. 3. Move your wrist so that your pinkie travels toward the floor. Try to only move your wrist and keep the rest of your arm still. 4. Hold this position for __________ seconds. 5. Slowly return your wrist to the starting position. Repeat __________ times. Complete this exercise __________ times a day. Ulnar deviation, eccentric 1. Sit with your left / right forearm supported on a table. Your thumb should be pointing upward, and your hand should be able to move down over the table edge. 2. Hold your uninjured arm in front of you and over your left / right hand. Hold a rubber exercise band or tube between your hands. Do not put any  tension on the exercise band or tube yet. 3. Move your left / right wrist so that your pinkie travels toward the floor. 4. Add more tension to the band or tube by pulling it upward with your uninjured hand. 5. Hold this position for __________ seconds. 6. Slowly return to the starting position, controlling the speed with your left / right hand and wrist (eccentric). Your hand  will move toward the ceiling, thumb first. Try to move only your hand and wrist and keep the rest of your arm still. Repeat __________ times. Complete this exercise __________ times a day. Wrist extension, eccentric 1. Sit with your left / right forearm supported on a table. Your palm should be facing the floor, and your hand should be able to move down over the table edge. 2. Hold a __________ weight in the palm of your left / right hand. 3. Using your uninjured hand, lift your left / right wrist up toward the ceiling. 4. Release your left / right hand and begin to very slowly lower it toward the floor (eccentric). 5. When you have gone as far as you can, use your uninjured hand to lift the left / right wrist up toward the ceiling again. Repeat __________ times. Complete this exercise __________ times a day. This information is not intended to replace advice given to you by your health care provider. Make sure you discuss any questions you have with your health care provider. Document Revised: 04/01/2019 Document Reviewed: 12/25/2018 Elsevier Patient Education  2021 [employer].

## 2021-05-11 NOTE — Progress Notes (Signed)
Subjective:    Patient ID: Dale Howard, male    DOB: 1991/07/10, 30 y.o.   MRN: [id number]  HPI  Pt is a 30 yo obese male with HTN, hypogonadism, vitamin D deficiency who presents to the clinic "to get back started on being healthy".   Patient kind of fell off the bandwagon after his calf injury where he was not able to work out anymore.  He has gained weight back.  He wants to get healthier again.  Patient was down to 287 and today he is up to 305. He would like to do phentermine again.   The weight gain has affected his mood.  He is much more down and depressed. No SI/HC.   Pt is out of medication. Denies any CP, palpitations, headaches of vision changes.   Pt is having some right wrist pain on the ulnar side with extension. No known injury. Painful with movement over the past 2-3 days. Not tried anything to make better.   .. Active Ambulatory Problems    Diagnosis Date Noted  . Lumbar disc herniation 05/05/2016  . Fatigue 05/05/2016  . Low testosterone 05/19/2016  . Elevated ALT measurement 05/19/2016  . Vitamin D deficiency 05/19/2016  . Essential hypertension, benign 06/05/2016  . Elevated hemoglobin (HCC) 06/05/2016  . Class 3 severe obesity due to excess calories with serious comorbidity and body mass index (BMI) of 40.0 to 44.9 in adult (HCC) 06/28/2016  . Male hypogonadism 06/28/2016  . Abnormal weight gain 06/28/2016  . Drug-induced erectile dysfunction 10/13/2016  . Low back pain radiating to left leg 02/06/2017  . Bronchospasm 04/24/2019  . Hypertension goal BP (blood pressure) < 130/80 04/24/2019  . Allergic rhinitis with postnasal drip 04/24/2019  . ACE-inhibitor cough 04/24/2019  . Right lateral epicondylitis 09/28/2020  . Extensor carpi ulnaris tendinitis 05/24/2021  . Non-restorative sleep 05/24/2021   Resolved Ambulatory Problems    Diagnosis Date Noted  . Morbid obesity due to excess calories (HCC) 05/05/2016  . Persistent cough for 3 weeks or longer  01/03/2018  . Acute cervical sprain 01/03/2018  . Closed displaced fracture of proximal phalanx of right middle finger 05/18/2016   Past Medical History:  Diagnosis Date  . Hypertension   . Hypogonadism in male   . Obesity         Review of Systems See HPI>     Objective:   Physical Exam Vitals reviewed.  Constitutional:      Appearance: Normal appearance. He is obese.  HENT:     Head: Normocephalic.     Right Ear: Tympanic membrane, ear canal and external ear normal. There is no impacted cerumen.     Left Ear: Tympanic membrane, ear canal and external ear normal. There is no impacted cerumen.     Nose: Nose normal. No congestion.     Mouth/Throat:     Mouth: Mucous membranes are moist.  Eyes:     Pupils: Pupils are equal, round, and reactive to light.  Neck:     Vascular: No carotid bruit.  Cardiovascular:     Rate and Rhythm: Normal rate and regular rhythm.     Pulses: Normal pulses.  Pulmonary:     Effort: Pulmonary effort is normal.     Breath sounds: Normal breath sounds.  Musculoskeletal:     Right lower leg: No edema.     Left lower leg: No edema.     Comments: Pain to palpation ulnar extensor tendon. No swelling. NROM. Some pain just  under the ulnar styloid. Pain worse with extension.   Lymphadenopathy:     Cervical: No cervical adenopathy.  Neurological:     General: No focal deficit present.     Mental Status: He is alert and oriented to person, place, and time.  Psychiatric:        Mood and Affect: Mood normal.       .. Depression screen [facility] 2/9 05/11/2021 09/21/2020 01/09/2020 09/06/2018 02/08/2018  Decreased Interest 1 0 0 0 0  Down, Depressed, Hopeless 1 0 0 0 1  PHQ - 2 Score 2 0 0 0 1  Altered sleeping 2 - 0 0 -  Tired, decreased energy 1 - 0 0 -  Change in appetite 1 - 0 0 -  Feeling bad or failure about yourself  1 - 0 0 -  Trouble concentrating 0 - 0 0 -  Moving slowly or fidgety/restless 0 - 0 0 -  Suicidal thoughts 0 - 0 0 -  PHQ-9  Score 7 - 0 0 -  Difficult doing work/chores Somewhat difficult - Not difficult at all Not difficult at all -   .. GAD 7 : Generalized Anxiety Score 05/11/2021 01/09/2020 09/06/2018  Nervous, Anxious, on Edge 1 0 0  Control/stop worrying 1 0 0  Worry too much - different things 1 0 0  Trouble relaxing 1 0 0  Restless 0 0 0  Easily annoyed or irritable 1 0 0  Afraid - awful might happen 1 0 0  Total GAD 7 Score 6 0 0  Anxiety Difficulty Somewhat difficult Not difficult at all Not difficult at all        Assessment & Plan:  Marland KitchenMarland KitchenHondo was seen today for fatigue.  Diagnoses and all orders for this visit:  Routine physical examination -     Testosterone Total,Free,Bio, Males -     Home sleep test -     Lipid Panel w/reflex Direct LDL -     CBC with Differential/Platelet -     TSH -     COMPLETE METABOLIC PANEL WITH GFR -     PSA  Fatigue, unspecified type  Low testosterone -     Testosterone Total,Free,Bio, Males -     PSA  Non-restorative sleep -     Home sleep test  Class 3 severe obesity due to excess calories with serious comorbidity and body mass index (BMI) of 40.0 to 44.9 in adult (HCC) -     Testosterone Total,Free,Bio, Males -     TSH -     buPROPion (WELLBUTRIN SR) 150 MG 12 hr tablet; Take 1 tablet (150 mg total) by mouth 2 (two) times daily.  Extensor carpi ulnaris tendinitis  Depressed mood -     buPROPion (WELLBUTRIN SR) 150 MG 12 hr tablet; Take 1 tablet (150 mg total) by mouth 2 (two) times daily.   Marland Kitchen.Start a regular exercise program and make sure you are eating a healthy diet Try to eat 4 servings of dairy a day or take a calcium supplement (500mg  twice a day). 2 covid vaccine with no booster.  PHQ 9/GAD 7 ok. Started wellbutrin. Follow up in 4 weeks. Discussed side effects.  Fasting labs ordered.  Sleep study ordered. Labs for fatigue ordered.   .Discussed low carb diet with 1500 calories and 80g of protein.  Exercising at least 150 minutes a week.   My Fitness Pal could be a Marland Kitchen.  BP TOO high not candidate for phentermine. Restarted BP medication.  Nurse visit in 2 weeks.   Pt has some tendonitis of extensor tendon of right wrist.  NSAID/IcE/rest/splint. Follow up as needed if not improving.

## 2021-05-24 ENCOUNTER — Encounter: Payer: Self-pay | Admitting: Physician Assistant

## 2021-05-24 DIAGNOSIS — G478 Other sleep disorders: Secondary | ICD-10-CM | POA: Insufficient documentation

## 2021-05-24 DIAGNOSIS — M778 Other enthesopathies, not elsewhere classified: Secondary | ICD-10-CM | POA: Insufficient documentation

## 2022-03-08 ENCOUNTER — Other Ambulatory Visit: Payer: Self-pay

## 2022-03-08 ENCOUNTER — Ambulatory Visit (INDEPENDENT_AMBULATORY_CARE_PROVIDER_SITE_OTHER): Payer: Managed Care, Other (non HMO) | Admitting: Physician Assistant

## 2022-03-08 ENCOUNTER — Encounter: Payer: Self-pay | Admitting: Physician Assistant

## 2022-03-08 VITALS — BP 148/92 | HR 88 | Resp 20 | Ht 71.0 in | Wt 320.0 lb

## 2022-03-08 DIAGNOSIS — I10 Essential (primary) hypertension: Secondary | ICD-10-CM | POA: Diagnosis not present

## 2022-03-08 DIAGNOSIS — Z125 Encounter for screening for malignant neoplasm of prostate: Secondary | ICD-10-CM | POA: Diagnosis not present

## 2022-03-08 DIAGNOSIS — Z6841 Body Mass Index (BMI) 40.0 and over, adult: Secondary | ICD-10-CM

## 2022-03-08 DIAGNOSIS — Z13220 Encounter for screening for lipoid disorders: Secondary | ICD-10-CM

## 2022-03-08 DIAGNOSIS — E611 Iron deficiency: Secondary | ICD-10-CM

## 2022-03-08 DIAGNOSIS — Z0000 Encounter for general adult medical examination without abnormal findings: Secondary | ICD-10-CM | POA: Diagnosis not present

## 2022-03-08 DIAGNOSIS — Z1329 Encounter for screening for other suspected endocrine disorder: Secondary | ICD-10-CM

## 2022-03-08 DIAGNOSIS — R7989 Other specified abnormal findings of blood chemistry: Secondary | ICD-10-CM | POA: Diagnosis not present

## 2022-03-08 MED ORDER — VALSARTAN-HYDROCHLOROTHIAZIDE 160-25 MG PO TABS: 1.0000 | ORAL_TABLET | Freq: Every day | ORAL | 0 refills | Status: DC

## 2022-03-08 MED ORDER — WEGOVY 0.25 MG/0.5ML ~~LOC~~ SOAJ: 0.2500 mg | SUBCUTANEOUS | 0 refills | Status: DC

## 2022-03-08 MED ORDER — WEGOVY 1 MG/0.5ML ~~LOC~~ SOAJ: 1.0000 mg | SUBCUTANEOUS | 0 refills | Status: DC

## 2022-03-08 MED ORDER — WEGOVY 0.5 MG/0.5ML ~~LOC~~ SOAJ: 0.5000 mg | SUBCUTANEOUS | 0 refills | Status: DC

## 2022-03-08 NOTE — Patient Instructions (Signed)

## 2022-03-08 NOTE — Progress Notes (Signed)
? ?Subjective:  ? ? Patient ID: Dale Howard, male    DOB: Jan 21, 1991, 31 y.o.   MRN: [id number] ? ?HPI ?Pt is a 31 yo male who presents to the clinic for CPE.  ? ?He admits he is not taking any of his medications. No CP, palpitations, headaches, or vision changes. He is staying active with his kids but no exercise.  ? ? ?.. ?Active Ambulatory Problems  ?  Diagnosis Date Noted  ? Lumbar disc herniation 05/05/2016  ? Fatigue 05/05/2016  ? Low testosterone 05/19/2016  ? Elevated ALT measurement 05/19/2016  ? Vitamin D deficiency 05/19/2016  ? Essential hypertension, benign 06/05/2016  ? Elevated hemoglobin (HCC) 06/05/2016  ? Class 3 severe obesity due to excess calories with serious comorbidity and body mass index (BMI) of 40.0 to 44.9 in adult [facility]) 06/28/2016  ? Male hypogonadism 06/28/2016  ? Abnormal weight gain 06/28/2016  ? Drug-induced erectile dysfunction 10/13/2016  ? Low back pain radiating to left leg 02/06/2017  ? Bronchospasm 04/24/2019  ? Hypertension goal BP (blood pressure) < 130/80 04/24/2019  ? Allergic rhinitis with postnasal drip 04/24/2019  ? ACE-inhibitor cough 04/24/2019  ? Right lateral epicondylitis 09/28/2020  ? Extensor carpi ulnaris tendinitis 05/24/2021  ? Non-restorative sleep 05/24/2021  ? ?Resolved Ambulatory Problems  ?  Diagnosis Date Noted  ? Morbid obesity due to excess calories (HCC) 05/05/2016  ? Persistent cough for 3 weeks or longer 01/03/2018  ? Acute cervical sprain 01/03/2018  ? Closed displaced fracture of proximal phalanx of right middle finger 05/18/2016  ? ?Past Medical History:  ?Diagnosis Date  ? Hypertension   ? Hypogonadism in male   ? Obesity   ? ?.. ?Social History  ? ?Socioeconomic History  ? Marital status: Single  ?  Spouse name: Not on file  ? Number of children: Not on file  ? Years of education: Not on file  ? Highest education level: Not on file  ?Occupational History  ? Not on file  ?Tobacco Use  ? Smoking status: Former  ? Smokeless tobacco: Never   ?Substance and Sexual Activity  ? Alcohol use: Yes  ?  Alcohol/week: 0.0 standard drinks  ? Drug use: No  ? Sexual activity: Yes  ?Other Topics Concern  ? Not on file  ?Social History Narrative  ? Not on file  ? ?Social Determinants of Health  ? ?Financial Resource Strain: Not on file  ?Food Insecurity: Not on file  ?Transportation Needs: Not on file  ?Physical Activity: Not on file  ?Stress: Not on file  ?Social Connections: Not on file  ?Intimate Partner Violence: Not on file  ? ?.. ?Family History  ?Problem Relation Age of Onset  ? Heart attack Maternal Grandmother   ? Hypertension Maternal Grandmother   ? Hyperlipidemia Maternal Grandmother   ? ? ?Review of Systems  ?All other systems reviewed and are negative. ? ?   ?Objective:  ? Physical Exam ?Vitals reviewed.  ? ?BP (!) 148/92 (BP Location: Right Arm, Patient Position: Sitting, Cuff Size: Large) Comment: Manual  Pulse 88   Resp 20   Ht 5\' 11"  (1.803 m)   Wt (!) 320 lb 0.6 oz (145.2 kg)   SpO2 97%   BMI 44.64 kg/m?  ? ?General Appearance:    Alert, cooperative, no distress, appears stated age  ?Head:    Normocephalic, without obvious abnormality, atraumatic  ?Eyes:    PERRL, conjunctiva/corneas clear, EOM's intact, fundi  ?  benign, both eyes       ?  Ears:    Normal TM's and external ear canals, both ears  ?Nose:   Nares normal, septum midline, mucosa normal, no drainage    or sinus tenderness  ?Throat:   Lips, mucosa, and tongue normal; teeth and gums normal  ?Neck:   Supple, symmetrical, trachea midline, no adenopathy;     ?  thyroid:  No enlargement/tenderness/nodules; no carotid ?  bruit or JVD  ?Back:     Symmetric, no curvature, ROM normal, no CVA tenderness  ?Lungs:     Clear to auscultation bilaterally, respirations unlabored  ?Chest wall:    No tenderness or deformity  ?Heart:    Regular rate and rhythm, S1 and S2 normal, no murmur, rub   or gallop  ?Abdomen:     Soft, non-tender, bowel sounds active all four quadrants,  ?  no masses, no  organomegaly  ?   ?   ?Extremities:   Extremities normal, atraumatic, no cyanosis or edema  ?Pulses:   2+ and symmetric all extremities  ?Skin:   Skin color, texture, turgor normal, no rashes or lesions  ?Lymph nodes:   Cervical, supraclavicular, and axillary nodes normal  ?Neurologic:   CNII-XII intact. Normal strength, sensation and reflexes    ?  throughout  ? ?.. ? ?  03/08/2022  ?  8:07 AM 05/11/2021  ? 11:32 AM 09/21/2020  ?  8:38 AM 01/09/2020  ?  8:50 AM 09/06/2018  ?  9:11 AM  ?Depression screen PHQ 2/9  ?Decreased Interest 0 1 0 0 0  ?Down, Depressed, Hopeless 0 1 0 0 0  ?PHQ - 2 Score 0 2 0 0 0  ?Altered sleeping 0 2  0 0  ?Tired, decreased energy 1 1  0 0  ?Change in appetite 0 1  0 0  ?Feeling bad or failure about yourself  0 1  0 0  ?Trouble concentrating 0 0  0 0  ?Moving slowly or fidgety/restless 0 0  0 0  ?Suicidal thoughts 0 0  0 0  ?PHQ-9 Score 1 7  0 0  ?Difficult doing work/chores Not difficult at all Somewhat difficult  Not difficult at all Not difficult at all  ? ? ? ? ?   ?Assessment & Plan:  ?..Camila LiOsman was seen today for annual exam. ? ?Diagnoses and all orders for this visit: ? ?Routine physical examination ?-     PSA ?-     COMPLETE METABOLIC PANEL WITH GFR ?-     TSH ?-     CBC with Differential/Platelet ?-     Lipid Panel w/reflex Direct LDL ?-     Testosterone Total,Free,Bio, Males ? ?Screening PSA (prostate specific antigen) ?-     PSA ? ?Low testosterone ?-     Testosterone Total,Free,Bio, Males ? ?Essential hypertension, benign ?-     COMPLETE METABOLIC PANEL WITH GFR ? ?Iron deficiency ?-     CBC with Differential/Platelet ? ?Thyroid disorder screen ?-     TSH ? ?Lipid screening ?-     Lipid Panel w/reflex Direct LDL ? ?Hypertension goal BP (blood pressure) < 130/80 ?-     valsartan-hydrochlorothiazide (DIOVAN-HCT) 160-25 MG tablet; Take 1 tablet by mouth daily. ? ?Class 3 severe obesity due to excess calories without serious comorbidity with body mass index (BMI) of 40.0 to 44.9 in adult  [facility]) ?-     Semaglutide-Weight Management (WEGOVY) 0.25 MG/0.5ML SOAJ; Inject 0.25 mg into the skin once a week. ?-     WEGOVY 0.5  MG/0.5ML SOAJ; Inject 0.5 mg into the skin once a week. Use this dose for 1 month (4 shots) and then increase to next higher dose. ?-     WEGOVY 1 MG/0.5ML SOAJ; Inject 1 mg into the skin once a week. Use this dose for 1 month (4 shots) and then increase to next higher dose. ? ?Marland Kitchen.Start a regular exercise program and make sure you are eating a healthy diet ?Try to eat 4 servings of dairy a day or take a calcium supplement (500mg  twice a day). ?Declined flu and covid. ?BP elevated. Restart medication. Recheck BP in 2 weeks.  ?Discussed weight loss ?Started wegovy follow up in 3 months. ? ? ? ?

## 2022-03-22 ENCOUNTER — Ambulatory Visit (INDEPENDENT_AMBULATORY_CARE_PROVIDER_SITE_OTHER): Payer: Managed Care, Other (non HMO) | Admitting: Physician Assistant

## 2022-03-22 VITALS — BP 168/100 | HR 111 | Temp 98.0°F | Ht 71.0 in | Wt 321.0 lb

## 2022-03-22 DIAGNOSIS — I10 Essential (primary) hypertension: Secondary | ICD-10-CM | POA: Diagnosis not present

## 2022-03-22 MED ORDER — VALSARTAN-HYDROCHLOROTHIAZIDE 320-25 MG PO TABS: 1.0000 | ORAL_TABLET | Freq: Every day | ORAL | 3 refills | Status: DC

## 2022-03-22 NOTE — Progress Notes (Signed)
Patient is here for blood pressure check.  ? ?Previous BP was 148/92 ? ?1st BP today: 181/95 ? ?2nd BP today (after 10  minutes): 168/100 ? ?Denies chest pain, dizziness, shortness of breath, severe headache, or nosebleeds. ? ?Taking medication as prescribed. Pt states he missed one dose. ? ?Per Breeback, dosage change to Valsartan-HCTZ 320-25g tablet. Recheck BP in 2 weeks ?

## 2022-03-22 NOTE — Progress Notes (Signed)
BP medication increased to max dose 320/25mg  and recheck in 2 weeks.  ?

## 2022-04-04 ENCOUNTER — Ambulatory Visit (INDEPENDENT_AMBULATORY_CARE_PROVIDER_SITE_OTHER): Payer: Managed Care, Other (non HMO) | Admitting: Physician Assistant

## 2022-04-04 VITALS — BP 149/83 | HR 105 | Temp 98.5°F | Ht 71.0 in | Wt 319.1 lb

## 2022-04-04 DIAGNOSIS — I10 Essential (primary) hypertension: Secondary | ICD-10-CM

## 2022-04-04 MED ORDER — AMLODIPINE BESYLATE 2.5 MG PO TABS: 2.5000 mg | ORAL_TABLET | Freq: Every day | ORAL | 0 refills | Status: DC

## 2022-04-04 NOTE — Progress Notes (Signed)
Patient is here for blood pressure check. Denies trouble sleeping, palpitations, dizziness, lightheadedness, blurry vision, chest pain, shortness of breath, headaches and/or medication problems.  ? ?Patient's blood pressure was out of goal range. Patient sat for 15 minutes. Blood pressure recheck was not at goal. Provider notified of current blood pressure readings. Per provider, patient is to start Norvasc 2.5 mg. Rx pended.  ? ?Per patient's request - manual blood pressure check completed. Blood pressure reading was 152/100. ? ?Patient informed to schedule next NV appointment in 2 weeks.  ?

## 2022-04-04 NOTE — Consult Note (Signed)
.  vh

## 2022-04-04 NOTE — Progress Notes (Signed)
BP better but still not to goal continue diovan HcT and then add norvasc 2.5mg . recheck in 2 weeks.  ?

## 2022-04-18 ENCOUNTER — Ambulatory Visit: Payer: Managed Care, Other (non HMO) | Admitting: Physician Assistant

## 2022-04-25 ENCOUNTER — Ambulatory Visit (INDEPENDENT_AMBULATORY_CARE_PROVIDER_SITE_OTHER): Payer: Managed Care, Other (non HMO) | Admitting: Physician Assistant

## 2022-04-25 ENCOUNTER — Encounter: Payer: Self-pay | Admitting: Physician Assistant

## 2022-04-25 VITALS — BP 129/74 | HR 90 | Ht 71.0 in | Wt 321.0 lb

## 2022-04-25 DIAGNOSIS — I10 Essential (primary) hypertension: Secondary | ICD-10-CM | POA: Diagnosis not present

## 2022-04-25 DIAGNOSIS — Z6841 Body Mass Index (BMI) 40.0 and over, adult: Secondary | ICD-10-CM

## 2022-04-25 DIAGNOSIS — G478 Other sleep disorders: Secondary | ICD-10-CM

## 2022-04-25 DIAGNOSIS — R0683 Snoring: Secondary | ICD-10-CM | POA: Diagnosis not present

## 2022-04-25 MED ORDER — PHENTERMINE HCL 37.5 MG PO TABS: ORAL_TABLET | ORAL | 0 refills | Status: DC

## 2022-04-25 MED ORDER — NALTREXONE-BUPROPION HCL ER 8-90 MG PO TB12
ORAL_TABLET | ORAL | 0 refills | Status: DC
Start: 2022-04-25 — End: 2022-05-23

## 2022-04-25 NOTE — Progress Notes (Signed)
? ?Established Patient Office Visit ? ?Subjective   ?Patient ID: Dale Howard, male    DOB: [birth year]  Age: 31 y.o. MRN: [id number] ? ?Chief Complaint  ?Patient presents with  ? Hypertension  ? ? ?HPI ?Pt is a 31 yo obese male here to follow up on BP.  ? ?For the last few visits BP has been elevated. Pt has restarted medications. He is feeling better. He never started norvasc. No CP, palpitations, headaches, vision changes. HA's went away with control of BP. He does report wanting to lose weight and his fatigue. Never had sleep study. Insurance would not pay for [employer]. Trial of phentermine in the past and would like to try again. He has tried medication, exericse, diets for last 15 years and weight been up and down.  ? ?Patient Active Problem List  ? Diagnosis Date Noted  ? Extensor carpi ulnaris tendinitis 05/24/2021  ? Non-restorative sleep 05/24/2021  ? Right lateral epicondylitis 09/28/2020  ? Bronchospasm 04/24/2019  ? Hypertension goal BP (blood pressure) < 130/80 04/24/2019  ? Allergic rhinitis with postnasal drip 04/24/2019  ? ACE-inhibitor cough 04/24/2019  ? Low back pain radiating to left leg 02/06/2017  ? Drug-induced erectile dysfunction 10/13/2016  ? Class 3 severe obesity due to excess calories without serious comorbidity with body mass index (BMI) of 40.0 to 44.9 in adult [facility]) 06/28/2016  ? Male hypogonadism 06/28/2016  ? Abnormal weight gain 06/28/2016  ? Essential hypertension, benign 06/05/2016  ? Elevated hemoglobin (HCC) 06/05/2016  ? Low testosterone 05/19/2016  ? Elevated ALT measurement 05/19/2016  ? Vitamin D deficiency 05/19/2016  ? Lumbar disc herniation 05/05/2016  ? Fatigue 05/05/2016  ? ?Past Medical History:  ?Diagnosis Date  ? Hypertension   ? Hypogonadism in male   ? Obesity   ? ?Family History  ?Problem Relation Age of Onset  ? Heart attack Maternal Grandmother   ? Hypertension Maternal Grandmother   ? Hyperlipidemia Maternal Grandmother   ? ?Allergies  ?Allergen Reactions  ?  Penicillins Other (See Comments)  ?  Unknown, childhood  ? ?  ? ?ROS ?See HPI.  ?  ?Objective:  ?  ? ?BP 129/74   Pulse 90   Ht 5\' 11"  (1.803 m)   Wt (!) 321 lb (145.6 kg)   SpO2 99%   BMI 44.77 kg/m?  ?BP Readings from Last 3 Encounters:  ?04/25/22 129/74  ?04/04/22 (!) 149/83  ?03/22/22 (!) 168/100  ? ?Wt Readings from Last 3 Encounters:  ?04/25/22 (!) 321 lb (145.6 kg)  ?04/04/22 (!) 319 lb 1.9 oz (144.8 kg)  ?03/22/22 (!) 321 lb (145.6 kg)  ? ?  ? ?Physical Exam ?Vitals reviewed.  ?Constitutional:   ?   Appearance: Normal appearance. He is obese.  ?HENT:  ?   Head: Normocephalic.  ?Cardiovascular:  ?   Rate and Rhythm: Normal rate and regular rhythm.  ?   Pulses: Normal pulses.  ?   Heart sounds: Normal heart sounds.  ?Pulmonary:  ?   Effort: Pulmonary effort is normal.  ?   Breath sounds: Normal breath sounds.  ?Musculoskeletal:  ?   Right lower leg: No edema.  ?   Left lower leg: No edema.  ?Neurological:  ?   General: No focal deficit present.  ?   Mental Status: He is alert and oriented to person, place, and time.  ?Psychiatric:     ?   Mood and Affect: Mood normal.  ? ? ? ? ?  ?Assessment & Plan:  ?[birth year]Marland Kitchen  Dale Howard was seen today for hypertension. ? ?Diagnoses and all orders for this visit: ? ?Essential hypertension, benign ?-     Home sleep test ?-     Amb Referral to Bariatric Surgery ? ?Class 3 severe obesity due to excess calories without serious comorbidity with body mass index (BMI) of 40.0 to 44.9 in adult [facility]) ?-     Naltrexone-buPROPion HCl ER 8-90 MG TB12; 1 tab daily for week 1, then 1 tab BID for week 2, then 2 tab PO qAM and 1 tab PO qPM for week 3, then 2 tabs BID. ?-     phentermine (ADIPEX-P) 37.5 MG tablet; One tab by mouth qAM ?-     Home sleep test ?-     Amb Referral to Bariatric Surgery ? ?Snoring ?-     Home sleep test ? ?Non-restorative sleep ?-     Home sleep test ? ? ?Needs to get fasting labs today.  ?BP much better today ?STOP BANG high risk for sleep apnea  ?Sleep study ordered ?Pt  has been up and down with his weight for the last 15 years ?Insurance will not pay for wegovy ?Trial of contrave ?He did lose some on phentermine and request to add to get him started ?Phentermine sent  ?Referral for bariatric surgery done ? ?Tandy Gaw, PA-C ? ?

## 2022-04-25 NOTE — Patient Instructions (Addendum)
Sleep study will order ?Will refer to bariatric surgery.  ? ?

## 2022-04-26 LAB — LIPID PANEL W/REFLEX DIRECT LDL
Cholesterol: 178 mg/dL (ref <200)
HDL: 45 mg/dL (ref >=40)
LDL Cholesterol (Calc): 103 mg/dL (calc) — ABNORMAL HIGH
Non-HDL Cholesterol (Calc): 133 mg/dL (calc) — ABNORMAL HIGH (ref <130)
Total CHOL/HDL Ratio: 4 (calc) (ref <5.0)
Triglycerides: 181 mg/dL — ABNORMAL HIGH (ref <150)

## 2022-04-26 LAB — COMPLETE METABOLIC PANEL WITH GFR
AG Ratio: 1.5 (calc) (ref 1.0–2.5)
ALT: 48 U/L — ABNORMAL HIGH (ref 9–46)
AST: 28 U/L (ref 10–40)
Albumin: 4.4 g/dL (ref 3.6–5.1)
Alkaline phosphatase (APISO): 89 U/L (ref 36–130)
BUN: 18 mg/dL (ref 7–25)
CO2: 24 mmol/L (ref 20–32)
Calcium: 9.3 mg/dL (ref 8.6–10.3)
Chloride: 104 mmol/L (ref 98–110)
Creat: 0.87 mg/dL (ref 0.60–1.26)
Globulin: 2.9 g/dL (calc) (ref 1.9–3.7)
Glucose, Bld: 87 mg/dL (ref 65–139)
Potassium: 3.9 mmol/L (ref 3.5–5.3)
Sodium: 137 mmol/L (ref 135–146)
Total Bilirubin: 0.4 mg/dL (ref 0.2–1.2)
Total Protein: 7.3 g/dL (ref 6.1–8.1)
eGFR: 118 mL/min/{1.73_m2} (ref >=60)

## 2022-04-26 LAB — CBC WITH DIFFERENTIAL/PLATELET
Absolute Monocytes: 765 cells/uL (ref 200–950)
Basophils Absolute: 27 cells/uL (ref 0–200)
Basophils Relative: 0.3 %
Eosinophils Absolute: 320 cells/uL (ref 15–500)
Eosinophils Relative: 3.6 %
HCT: 48 % (ref 38.5–50.0)
Hemoglobin: 16.6 g/dL (ref 13.2–17.1)
Lymphs Abs: 2118 cells/uL (ref 850–3900)
MCH: 30.3 pg (ref 27.0–33.0)
MCHC: 34.6 g/dL (ref 32.0–36.0)
MCV: 87.8 fL (ref 80.0–100.0)
MPV: 11.6 fL (ref 7.5–12.5)
Monocytes Relative: 8.6 %
Neutro Abs: 5669 cells/uL (ref 1500–7800)
Neutrophils Relative %: 63.7 %
Platelets: 198 10*3/uL (ref 140–400)
RBC: 5.47 10*6/uL (ref 4.20–5.80)
RDW: 13.3 % (ref 11.0–15.0)
Total Lymphocyte: 23.8 %
WBC: 8.9 10*3/uL (ref 3.8–10.8)

## 2022-04-26 LAB — PSA: PSA: 0.42 ng/mL (ref <=4.00)

## 2022-04-26 LAB — TESTOSTERONE TOTAL,FREE,BIO, MALES
Albumin: 4.4 g/dL (ref 3.6–5.1)
Sex Hormone Binding: 17 nmol/L (ref 10–50)
Testosterone: 210 ng/dL — ABNORMAL LOW (ref 250–827)

## 2022-04-26 LAB — TSH: TSH: 0.84 mIU/L (ref 0.40–4.50)

## 2022-04-26 NOTE — Progress Notes (Signed)
Kidney and glucose look great.  ?ALT up just a little. Avoiding alcohol, tylenol, and regular exercise can get this number down.  ?Prostate looks good.  ?Hemoglobin looks good.  ?Thyroid looks good.  ?Cholesterol looks ok.  ?TG are a little high.  ?Testosterone low. Repeat in next 2 weeks if two lows then would qualify for testosterone replacement.  ?

## 2022-04-28 ENCOUNTER — Encounter: Payer: Self-pay | Admitting: Physician Assistant

## 2022-04-28 DIAGNOSIS — R7989 Other specified abnormal findings of blood chemistry: Secondary | ICD-10-CM

## 2022-05-13 LAB — TESTOSTERONE: Testosterone: 243 ng/dL — ABNORMAL LOW (ref 250–827)

## 2022-05-17 ENCOUNTER — Encounter: Payer: Self-pay | Admitting: Physician Assistant

## 2022-05-17 NOTE — Progress Notes (Signed)
Testosterone consistently low. Would you like to try topical or injections to replace your testosterone?

## 2022-05-23 ENCOUNTER — Ambulatory Visit (INDEPENDENT_AMBULATORY_CARE_PROVIDER_SITE_OTHER): Payer: Managed Care, Other (non HMO) | Admitting: Physician Assistant

## 2022-05-23 ENCOUNTER — Encounter: Payer: Self-pay | Admitting: Physician Assistant

## 2022-05-23 VITALS — BP 136/85 | HR 83 | Ht 71.0 in | Wt 314.0 lb

## 2022-05-23 DIAGNOSIS — K219 Gastro-esophageal reflux disease without esophagitis: Secondary | ICD-10-CM

## 2022-05-23 DIAGNOSIS — E66813 Obesity, class 3: Secondary | ICD-10-CM

## 2022-05-23 DIAGNOSIS — Z6841 Body Mass Index (BMI) 40.0 and over, adult: Secondary | ICD-10-CM

## 2022-05-23 DIAGNOSIS — I10 Essential (primary) hypertension: Secondary | ICD-10-CM | POA: Diagnosis not present

## 2022-05-23 DIAGNOSIS — E291 Testicular hypofunction: Secondary | ICD-10-CM

## 2022-05-23 MED ORDER — PHENTERMINE HCL 37.5 MG PO TABS
ORAL_TABLET | ORAL | 0 refills | Status: DC
Start: 2022-05-23 — End: 2023-03-12

## 2022-05-23 MED ORDER — OMEPRAZOLE 40 MG PO CPDR: 40.0000 mg | DELAYED_RELEASE_CAPSULE | Freq: Every day | ORAL | 3 refills | Status: DC

## 2022-05-23 NOTE — Progress Notes (Signed)
   Established Patient Office Visit  Subjective   Patient ID: Dale Howard, male    DOB: 10/04/1991  Age: 31 y.o. MRN: [id number]  Chief Complaint  Patient presents with   Follow-up    HPI  Dale Howard is a 31 year old male presenting for a follow up for hypertension and weight loss. He reports that he has been doing well. He is down 8 lbs overall. He continues to lift weights, walk on the treadmill, make proper dietary choices. He requests a refill of phentermine and Diovan. He notes that he has experienced some episodes of reflux. Theses episodes occur sporadically. He is ready to begin his series of testosterone injections.   Review of Systems  Gastrointestinal:  Positive for heartburn.  All other systems reviewed and are negative.    Objective:     BP 136/85   Pulse 83   Ht 5\' 11"  (1.803 m)   Wt (!) 314 lb (142.4 kg)   SpO2 99%   BMI 43.79 kg/m  BP Readings from Last 3 Encounters:  05/23/22 136/85  04/25/22 129/74  04/04/22 (!) 149/83   Wt Readings from Last 3 Encounters:  05/23/22 (!) 314 lb (142.4 kg)  04/25/22 (!) 321 lb (145.6 kg)  04/04/22 (!) 319 lb 1.9 oz (144.8 kg)      Physical Exam Vitals reviewed.  Constitutional:      General: He is not in acute distress.    Appearance: He is obese. He is not ill-appearing.  HENT:     Head: Normocephalic and atraumatic.  Cardiovascular:     Rate and Rhythm: Normal rate and regular rhythm.     Pulses: Normal pulses.     Heart sounds: Normal heart sounds.  Pulmonary:     Effort: Pulmonary effort is normal.     Breath sounds: Normal breath sounds.  Musculoskeletal:     Right lower leg: No edema.     Left lower leg: No edema.  Skin:    General: Skin is warm and dry.  Neurological:     Mental Status: He is alert and oriented to person, place, and time.  Psychiatric:        Mood and Affect: Mood normal.        Behavior: Behavior normal.       Assessment & Plan:   Dale Howard was seen today for  follow-up.  Diagnoses and all orders for this visit:  Essential hypertension, benign  Class 3 severe obesity due to excess calories without serious comorbidity with body mass index (BMI) of 40.0 to 44.9 in adult (HCC) -     phentermine (ADIPEX-P) 37.5 MG tablet; One tab by mouth qAM  Male hypogonadism  Gastroesophageal reflux disease without esophagitis -     omeprazole (PRILOSEC) 40 MG capsule; Take 1 capsule (40 mg total) by mouth daily.   BP looks great Continue on hyzaar Down 8lbs, refilled phentermine Continue with diet and exercise Testosterone shot given today-pt wants to give his own and was shown how today and should be let to do his own in 2 weeks Omeprazole for reflux Follow up weight in 3 months.    Return in about 2 weeks (around 06/06/2022) for testosterone injections.    06/08/2022, PA-C

## 2022-06-06 ENCOUNTER — Ambulatory Visit (INDEPENDENT_AMBULATORY_CARE_PROVIDER_SITE_OTHER): Payer: Managed Care, Other (non HMO) | Admitting: Physician Assistant

## 2022-06-06 VITALS — BP 130/78 | HR 71 | Resp 20

## 2022-06-06 DIAGNOSIS — E291 Testicular hypofunction: Secondary | ICD-10-CM | POA: Diagnosis not present

## 2022-06-06 MED ORDER — TESTOSTERONE CYPIONATE 200 MG/ML IM SOLN: 100.0000 mg | Freq: Once | INTRAMUSCULAR | Status: DC

## 2022-06-06 MED ORDER — TESTOSTERONE CYPIONATE 200 MG/ML IM SOLN
200.0000 mg | Freq: Once | INTRAMUSCULAR | Status: AC
  Administered 2022-06-21: 200 mg via INTRAMUSCULAR

## 2022-06-06 NOTE — Progress Notes (Signed)
   Subjective:    Patient ID: Dale Howard, male    DOB: 1991/06/24, 31 y.o.   MRN: [id number]  HPI  Patient is here for a testosterone injection. Denies chest pain, shortness of breath, headaches and problems with medication or mood changes.  Review of Systems     Objective:   Physical Exam        Assessment & Plan:   Patient tolerated injection in the Left Upper Outer Quadrant well without complications. Patient advised to schedule next injection in 2 weeks.  Patient unaware that the injection was going in his upper outer quadrant. He stated that he got his first injection in his Right arm. He was under the impression that he was going to get taught how to give himself the injection, but he never received the Testosterone medication at the pharmacy. I didn't do any teaching today since the notes from office visit states that the teaching was done on 05/23/2022.  The patient would just prefer to come to the office and have [place] to do his Testosterone injection for him.   Testosterone 200 mg added to his active medication list.

## 2022-06-07 ENCOUNTER — Encounter: Payer: Self-pay | Admitting: Physician Assistant

## 2022-06-07 NOTE — Progress Notes (Signed)
Agree with above plan.  Follow up in 2 weeks.  Recheck testosterone in 3 months in blood.

## 2022-06-14 ENCOUNTER — Encounter: Payer: Self-pay | Admitting: Physician Assistant

## 2022-06-14 ENCOUNTER — Ambulatory Visit (INDEPENDENT_AMBULATORY_CARE_PROVIDER_SITE_OTHER): Payer: Managed Care, Other (non HMO) | Admitting: Physician Assistant

## 2022-06-14 VITALS — BP 128/68 | HR 84 | Ht 71.0 in | Wt 312.0 lb

## 2022-06-14 DIAGNOSIS — E291 Testicular hypofunction: Secondary | ICD-10-CM | POA: Diagnosis not present

## 2022-06-14 DIAGNOSIS — Z6841 Body Mass Index (BMI) 40.0 and over, adult: Secondary | ICD-10-CM | POA: Diagnosis not present

## 2022-06-14 MED ORDER — BD SAFETYGLIDE NEEDLE 21G X 1-1/2" 3 ML MISC
1.0000 | 0 refills | Status: DC
Start: 2022-06-14 — End: 2022-09-27

## 2022-06-14 MED ORDER — TESTOSTERONE CYPIONATE 200 MG/ML IM SOLN: 200.0000 mg | INTRAMUSCULAR | 0 refills | Status: DC

## 2022-06-14 NOTE — Progress Notes (Signed)
Established Patient Office Visit  Subjective   Patient ID: Dale Howard, male    DOB: 1991-04-14  Age: 31 y.o. MRN: [id number]  Chief Complaint  Patient presents with   Follow-up    HPI  Dale Howard is a 31 year old male presenting for a follow up for hypertension and weight loss. He reports that he has been doing well. He is down 2 lbs since his visit on 05/23/22. He is currently taking a half phentermine in the morning and a half phentermine in the evening. He is tolerating his testosterone injections well, but Is unsure if he is feeling any effects from them. He is reporting no side effects from phentermine. Denies chest pain, palpitations, insomnia, injection site reaction.  .. Active Ambulatory Problems    Diagnosis Date Noted   Lumbar disc herniation 05/05/2016   Fatigue 05/05/2016   Low testosterone 05/19/2016   Elevated ALT measurement 05/19/2016   Vitamin D deficiency 05/19/2016   Essential hypertension, benign 06/05/2016   Elevated hemoglobin (HCC) 06/05/2016   Class 3 severe obesity due to excess calories without serious comorbidity with body mass index (BMI) of 40.0 to 44.9 in adult [facility]) 06/28/2016   Male hypogonadism 06/28/2016   Abnormal weight gain 06/28/2016   Drug-induced erectile dysfunction 10/13/2016   Low back pain radiating to left leg 02/06/2017   Bronchospasm 04/24/2019   Hypertension goal BP (blood pressure) < 130/80 04/24/2019   Allergic rhinitis with postnasal drip 04/24/2019   ACE-inhibitor cough 04/24/2019   Right lateral epicondylitis 09/28/2020   Extensor carpi ulnaris tendinitis 05/24/2021   Non-restorative sleep 05/24/2021   Gastroesophageal reflux disease without esophagitis 05/23/2022   Resolved Ambulatory Problems    Diagnosis Date Noted   Morbid obesity due to excess calories (HCC) 05/05/2016   Persistent cough for 3 weeks or longer 01/03/2018   Acute cervical sprain 01/03/2018   Closed displaced fracture of proximal phalanx of right  middle finger 05/18/2016   Past Medical History:  Diagnosis Date   Hypertension    Hypogonadism in male    Obesity     Review of Systems  All other systems reviewed and are negative.     Objective:     BP 128/68   Pulse 84   Ht 5\' 11"  (1.803 m)   Wt (!) 312 lb (141.5 kg)   SpO2 99%   BMI 43.52 kg/m  BP Readings from Last 3 Encounters:  06/14/22 128/68  06/06/22 130/78  05/23/22 136/85   Wt Readings from Last 3 Encounters:  06/14/22 (!) 312 lb (141.5 kg)  05/23/22 (!) 314 lb (142.4 kg)  04/25/22 (!) 321 lb (145.6 kg)      Physical Exam Vitals reviewed.  Constitutional:      General: He is not in acute distress.    Appearance: He is obese. He is not ill-appearing.  Eyes:     General: No scleral icterus.    Extraocular Movements: Extraocular movements intact.     Conjunctiva/sclera: Conjunctivae normal.  Skin:    General: Skin is warm and dry.  Neurological:     Mental Status: He is alert and oriented to person, place, and time.  Psychiatric:        Mood and Affect: Mood normal.        Behavior: Behavior normal.         Assessment & Plan:   Dale Howard was seen today for follow-up.  Diagnoses and all orders for this visit:  Male hypogonadism -  Testosterone Total,Free,Bio, Males -     CBC w/Diff/Platelet -     testosterone cypionate (DEPOTESTOSTERONE CYPIONATE) 200 MG/ML injection; Inject 1 mL (200 mg total) into the muscle every 14 (fourteen) days.  Class 3 severe obesity due to excess calories without serious comorbidity with body mass index (BMI) of 40.0 to 44.9 in adult (HCC)  Other orders -     SYRINGE-NEEDLE, DISP, 3 ML (BD SAFETYGLIDE NEEDLE) 21G X 1-1/2" 3 ML MISC; 1 Device by Does not apply route every 14 (fourteen) days. To use with testosterone  Patient doing well overall.  Tolerating medications well. Weight loss of 2 pounds in the last 20 days. Would like to see more weight come off. Encouraged continued exercise and dietary  modifications.  Sent supplies for home injections of testosterone every 2 weeks.  Will check labs in next month. BP is stable. Taking Bps at home with normal readings.    Return in about 6 months (around 12/14/2022).

## 2022-06-21 ENCOUNTER — Ambulatory Visit (INDEPENDENT_AMBULATORY_CARE_PROVIDER_SITE_OTHER): Payer: Managed Care, Other (non HMO) | Admitting: Physician Assistant

## 2022-06-21 VITALS — BP 125/75 | HR 86 | Resp 20

## 2022-06-21 DIAGNOSIS — E291 Testicular hypofunction: Secondary | ICD-10-CM | POA: Diagnosis not present

## 2022-06-21 NOTE — Progress Notes (Signed)
   Subjective:    Patient ID: Dale Howard, male    DOB: [birth year], 31 y.o.   MRN: [id number]  HPI Patient is here for a testosterone injection. Denies chest pain, shortness of breath, headaches and problems with medication or mood changes.   Review of Systems     Objective:   Physical Exam        Assessment & Plan:   Patient had a brief education today. Patient given paper education. Patient was instructed on how to clean the injection site and the patient administered the injection that was drawn up in the office.   Patient advised to schedule a nurse visit in 2 weeks for a testosterone injection with his supplies to receive complete education on how to draw up the medication and to injection himself.

## 2022-06-21 NOTE — Progress Notes (Signed)
Agree with above plan. 

## 2022-06-29 ENCOUNTER — Other Ambulatory Visit: Payer: Self-pay | Admitting: Physician Assistant

## 2022-06-29 DIAGNOSIS — I10 Essential (primary) hypertension: Secondary | ICD-10-CM

## 2022-07-05 ENCOUNTER — Ambulatory Visit: Payer: Managed Care, Other (non HMO)

## 2022-07-24 ENCOUNTER — Ambulatory Visit (HOSPITAL_BASED_OUTPATIENT_CLINIC_OR_DEPARTMENT_OTHER): Payer: Managed Care, Other (non HMO) | Admitting: Internal Medicine

## 2022-07-26 ENCOUNTER — Ambulatory Visit (HOSPITAL_BASED_OUTPATIENT_CLINIC_OR_DEPARTMENT_OTHER): Payer: Managed Care, Other (non HMO) | Attending: Physician Assistant | Admitting: Internal Medicine

## 2022-07-26 VITALS — Ht 70.0 in | Wt 305.0 lb

## 2022-07-26 LAB — CBC WITH DIFFERENTIAL/PLATELET
Absolute Monocytes: 619 cells/uL (ref 200–950)
Basophils Absolute: 17 cells/uL (ref 0–200)
Basophils Relative: 0.2 %
Eosinophils Absolute: 181 cells/uL (ref 15–500)
Eosinophils Relative: 2.1 %
HCT: 49.5 % (ref 38.5–50.0)
Hemoglobin: 17 g/dL (ref 13.2–17.1)
Lymphs Abs: 2245 cells/uL (ref 850–3900)
MCH: 30.9 pg (ref 27.0–33.0)
MCHC: 34.3 g/dL (ref 32.0–36.0)
MCV: 90 fL (ref 80.0–100.0)
MPV: 11.3 fL (ref 7.5–12.5)
Monocytes Relative: 7.2 %
Neutro Abs: 5538 cells/uL (ref 1500–7800)
Neutrophils Relative %: 64.4 %
Platelets: 200 10*3/uL (ref 140–400)
RBC: 5.5 10*6/uL (ref 4.20–5.80)
RDW: 12.9 % (ref 11.0–15.0)
Total Lymphocyte: 26.1 %
WBC: 8.6 10*3/uL (ref 3.8–10.8)

## 2022-07-26 LAB — TESTOSTERONE TOTAL,FREE,BIO, MALES
Albumin: 4.5 g/dL (ref 3.6–5.1)
Sex Hormone Binding: 17 nmol/L (ref 10–50)
Testosterone, Bioavailable: 443.5 ng/dL (ref 110.0–575.0)
Testosterone, Free: 215.6 pg/mL (ref 46.0–224.0)
Testosterone: 863 ng/dL — ABNORMAL HIGH (ref 250–827)

## 2022-07-26 NOTE — Progress Notes (Signed)
Testosterone is upper limits of normal. If you are feeling ok we can keep here as long as not going any higher. Recheck in 6 months if trending up more then we need to decrease a little.

## 2022-08-01 ENCOUNTER — Encounter (HOSPITAL_BASED_OUTPATIENT_CLINIC_OR_DEPARTMENT_OTHER): Payer: Managed Care, Other (non HMO) | Admitting: Internal Medicine

## 2022-09-27 ENCOUNTER — Other Ambulatory Visit: Payer: Self-pay | Admitting: Physician Assistant

## 2022-09-28 MED ORDER — "BD SAFETYGLIDE NEEDLE 21G X 1-1/2"" 3 ML MISC": 1.0000 | 0 refills | Status: DC

## 2022-10-03 ENCOUNTER — Other Ambulatory Visit: Payer: Self-pay | Admitting: Family Medicine

## 2022-12-15 ENCOUNTER — Ambulatory Visit: Payer: Managed Care, Other (non HMO) | Admitting: Physician Assistant

## 2023-01-02 ENCOUNTER — Telehealth: Payer: Self-pay | Admitting: Physician Assistant

## 2023-01-03 NOTE — Telephone Encounter (Signed)
Last written 05/23/2022 #90 with no refills Last appt 06/14/2022

## 2023-01-03 NOTE — Telephone Encounter (Signed)
Needs appt

## 2023-01-03 NOTE — Telephone Encounter (Signed)
Patient is scheduled 03-12-23 for physical and phentermine refill.

## 2023-02-02 ENCOUNTER — Telehealth: Payer: Self-pay | Admitting: General Practice

## 2023-02-02 NOTE — Transitions of Care (Post Inpatient/ED Visit) (Unsigned)
   02/02/2023  Name: Dale Howard MRN: [id number] DOB: [birth year]  Today's TOC FU Call Status: Today's TOC FU Call Status:: Unsuccessul Call (1st Attempt) Unsuccessful Call (1st Attempt) Date: 02/02/23  Attempted to reach the patient regarding the most recent Inpatient/ED visit.  Follow Up Plan: Additional outreach attempts will be made to reach the patient to complete the Transitions of Care (Post Inpatient/ED visit) call.   Signature Tinnie Gens, RN BSN

## 2023-02-05 NOTE — Transitions of Care (Post Inpatient/ED Visit) (Signed)
   02/05/2023  Name: Dale Howard MRN: [id number] DOB: [birth year]  Today's TOC FU Call Status: Today's TOC FU Call Status:: Unsuccessful Call (2nd Attempt) Unsuccessful Call (1st Attempt) Date: 02/02/23 Unsuccessful Call (2nd Attempt) Date: 02/05/23  Attempted to reach the patient regarding the most recent Inpatient/ED visit.  Follow Up Plan: Additional outreach attempts will be made to reach the patient to complete the Transitions of Care (Post Inpatient/ED visit) call.   Signature Tinnie Gens, RN BSN

## 2023-02-08 NOTE — Transitions of Care (Post Inpatient/ED Visit) (Signed)
   02/08/2023  Name: Dale Howard MRN: [id number] DOB: May 22, 1991  Today's TOC FU Call Status: Today's TOC FU Call Status:: Unsuccessful Call (3rd Attempt) Unsuccessful Call (1st Attempt) Date: 02/02/23 Unsuccessful Call (2nd Attempt) Date: 02/05/23 Unsuccessful Call (3rd Attempt) Date: 02/08/23  Attempted to reach the patient regarding the most recent Inpatient/ED visit.  Follow Up Plan: No further outreach attempts will be made at this time. We have been unable to contact the patient.  Signature Tinnie Gens, RN BSN

## 2023-03-12 ENCOUNTER — Ambulatory Visit (INDEPENDENT_AMBULATORY_CARE_PROVIDER_SITE_OTHER): Payer: Managed Care, Other (non HMO) | Admitting: Physician Assistant

## 2023-03-12 VITALS — BP 142/86 | HR 86 | Ht 70.0 in | Wt 298.0 lb

## 2023-03-12 DIAGNOSIS — G478 Other sleep disorders: Secondary | ICD-10-CM | POA: Diagnosis not present

## 2023-03-12 DIAGNOSIS — R0683 Snoring: Secondary | ICD-10-CM

## 2023-03-12 DIAGNOSIS — E291 Testicular hypofunction: Secondary | ICD-10-CM | POA: Diagnosis not present

## 2023-03-12 DIAGNOSIS — I10 Essential (primary) hypertension: Secondary | ICD-10-CM | POA: Diagnosis not present

## 2023-03-12 DIAGNOSIS — Z0000 Encounter for general adult medical examination without abnormal findings: Secondary | ICD-10-CM

## 2023-03-12 DIAGNOSIS — Z6841 Body Mass Index (BMI) 40.0 and over, adult: Secondary | ICD-10-CM

## 2023-03-12 MED ORDER — TESTOSTERONE CYPIONATE 200 MG/ML IM SOLN: 200.0000 mg | INTRAMUSCULAR | 0 refills | Status: DC

## 2023-03-12 MED ORDER — PHENTERMINE HCL 37.5 MG PO TABS: ORAL_TABLET | ORAL | 0 refills | Status: DC

## 2023-03-12 MED ORDER — VALSARTAN-HYDROCHLOROTHIAZIDE 320-25 MG PO TABS: 1.0000 | ORAL_TABLET | Freq: Every day | ORAL | 3 refills | Status: DC

## 2023-03-12 NOTE — Progress Notes (Unsigned)
   Complete physical exam  Patient: Dale Howard   DOB: 04-Jun-1991   32 y.o. Male  MRN: [id number]  Subjective:    Chief Complaint  Patient presents with   Annual Exam    Dale Howard is a 32 y.o. male who presents today for a complete physical exam. He reports consuming a {diet types:17450} diet. {types:19826} He generally feels {DESC; WELL/FAIRLY WELL/POORLY:18703}. He reports sleeping {DESC; WELL/FAIRLY WELL/POORLY:18703}. He {does/does not:200015} have additional problems to discuss today.    Most recent fall risk assessment:    03/12/2023    8:59 AM  Fall Risk   Falls in the past year? 0  Number falls in past yr: 0  Injury with Fall? 0  Risk for fall due to : No Fall Risks  Follow up Falls evaluation completed     Most recent depression screenings:    03/12/2023    8:59 AM 06/14/2022    7:17 AM  PHQ 2/9 Scores  PHQ - 2 Score 0 0    {VISON DENTAL STD PSA (Optional):27386}  {History (Optional):23778}  Patient Care Team: Lavada Mesi as PCP - General (Family Medicine)   Outpatient Medications Prior to Visit  Medication Sig   valsartan-hydrochlorothiazide (DIOVAN-HCT) 320-25 MG tablet Take 1 tablet by mouth daily.   omeprazole (PRILOSEC) 40 MG capsule Take 1 capsule (40 mg total) by mouth daily. (Patient not taking: Reported on 03/12/2023)   phentermine (ADIPEX-P) 37.5 MG tablet One tab by mouth qAM (Patient not taking: Reported on 03/12/2023)   SYRINGE-NEEDLE, DISP, 3 ML (BD SAFETYGLIDE NEEDLE) 21G X 1-1/2" 3 ML MISC 1 Device by Does not apply route every 14 (fourteen) days. To use with testosterone (Patient not taking: Reported on 03/12/2023)   testosterone cypionate (DEPOTESTOSTERONE CYPIONATE) 200 MG/ML injection Inject 1 mL (200 mg total) into the muscle every 14 (fourteen) days. (Patient not taking: Reported on 03/12/2023)   No facility-administered medications prior to visit.    ROS See HPI.        Objective:     BP (!) 148/97   Pulse 86    Ht 5\' 10"  (1.778 m)   Wt 298 lb (135.2 kg)   SpO2 98%   BMI 42.76 kg/m  BP Readings from Last 3 Encounters:  03/12/23 (!) 148/97  06/21/22 125/75  06/14/22 128/68   Wt Readings from Last 3 Encounters:  03/12/23 298 lb (135.2 kg)  07/26/22 (!) 305 lb (138.3 kg)  06/14/22 (!) 312 lb (141.5 kg)      Physical Exam   No results found for any visits on 03/12/23. {Show previous labs (optional):23779}    Assessment & Plan:    Routine Health Maintenance and Physical Exam  Immunization History  Administered Date(s) Administered   PFIZER(Purple Top)SARS-COV-2 Vaccination 08/23/2020, 09/20/2020   Tdap 09/06/2018    Health Maintenance  Topic Date Due   HIV Screening  Never done   INFLUENZA VACCINE  03/18/2023 (Originally 07/18/2022)   Hepatitis C Screening  05/24/2023 (Originally 03/17/2008)   COVID-19 Vaccine (3 - 2023-24 season) 03/11/2024 (Originally 08/18/2022)   DTaP/Tdap/Td (2 - Td or Tdap) 09/06/2028   HPV VACCINES  Aged Out    Discussed health benefits of physical activity, and encouraged him to engage in regular exercise appropriate for his age and condition.  Problem List Items Addressed This Visit   None  No follow-ups on file.     Iran Planas, PA-C

## 2023-03-12 NOTE — Patient Instructions (Signed)
Health Maintenance, Male Adopting a healthy lifestyle and getting preventive care are important in promoting health and wellness. Ask your health care provider about: The right schedule for you to have regular tests and exams. Things you can do on your own to prevent diseases and keep yourself healthy. What should I know about diet, weight, and exercise? Eat a healthy diet  Eat a diet that includes plenty of vegetables, fruits, low-fat dairy products, and lean protein. Do not eat a lot of foods that are high in solid fats, added sugars, or sodium. Maintain a healthy weight Body mass index (BMI) is a measurement that can be used to identify possible weight problems. It estimates body fat based on height and weight. Your health care provider can help determine your BMI and help you achieve or maintain a healthy weight. Get regular exercise Get regular exercise. This is one of the most important things you can do for your health. Most adults should: Exercise for at least 150 minutes each week. The exercise should increase your heart rate and make you sweat (moderate-intensity exercise). Do strengthening exercises at least twice a week. This is in addition to the moderate-intensity exercise. Spend less time sitting. Even light physical activity can be beneficial. Watch cholesterol and blood lipids Have your blood tested for lipids and cholesterol at 33 years of age, then have this test every 5 years. You may need to have your cholesterol levels checked more often if: Your lipid or cholesterol levels are high. You are older than 33 years of age. You are at high risk for heart disease. What should I know about cancer screening? Many types of cancers can be detected early and may often be prevented. Depending on your health history and family history, you may need to have cancer screening at various ages. This may include screening for: Colorectal cancer. Prostate cancer. Skin cancer. Lung  cancer. What should I know about heart disease, diabetes, and high blood pressure? Blood pressure and heart disease High blood pressure causes heart disease and increases the risk of stroke. This is more likely to develop in people who have high blood pressure readings or are overweight. Talk with your health care provider about your target blood pressure readings. Have your blood pressure checked: Every 3-5 years if you are 18-39 years of age. Every year if you are 40 years old or older. If you are between the ages of 65 and 75 and are a current or former smoker, ask your health care provider if you should have a one-time screening for abdominal aortic aneurysm (AAA). Diabetes Have regular diabetes screenings. This checks your fasting blood sugar level. Have the screening done: Once every three years after age 45 if you are at a normal weight and have a low risk for diabetes. More often and at a younger age if you are overweight or have a high risk for diabetes. What should I know about preventing infection? Hepatitis B If you have a higher risk for hepatitis B, you should be screened for this virus. Talk with your health care provider to find out if you are at risk for hepatitis B infection. Hepatitis C Blood testing is recommended for: Everyone born from 1945 through 1965. Anyone with known risk factors for hepatitis C. Sexually transmitted infections (STIs) You should be screened each year for STIs, including gonorrhea and chlamydia, if: You are sexually active and are younger than 33 years of age. You are older than 33 years of age and your   health care provider tells you that you are at risk for this type of infection. Your sexual activity has changed since you were last screened, and you are at increased risk for chlamydia or gonorrhea. Ask your health care provider if you are at risk. Ask your health care provider about whether you are at high risk for HIV. Your health care provider  may recommend a prescription medicine to help prevent HIV infection. If you choose to take medicine to prevent HIV, you should first get tested for HIV. You should then be tested every 3 months for as long as you are taking the medicine. Follow these instructions at home: Alcohol use Do not drink alcohol if your health care provider tells you not to drink. If you drink alcohol: Limit how much you have to 0-2 drinks a day. Know how much alcohol is in your drink. In the [place] one drink equals one 12 oz bottle of beer (355 mL), one 5 oz glass of wine (148 mL), or one 1 oz glass of hard liquor (44 mL). Lifestyle Do not use any products that contain nicotine or tobacco. These products include cigarettes, chewing tobacco, and vaping devices, such as e-cigarettes. If you need help quitting, ask your health care provider. Do not use street drugs. Do not share needles. Ask your health care provider for help if you need support or information about quitting drugs. General instructions Schedule regular health, dental, and eye exams. Stay current with your vaccines. Tell your health care provider if: You often feel depressed. You have ever been abused or do not feel safe at home. Summary Adopting a healthy lifestyle and getting preventive care are important in promoting health and wellness. Follow your health care provider's instructions about healthy diet, exercising, and getting tested or screened for diseases. Follow your health care provider's instructions on monitoring your cholesterol and blood pressure. This information is not intended to replace advice given to you by your health care provider. Make sure you discuss any questions you have with your health care provider. Document Revised: 04/25/2021 Document Reviewed: 04/25/2021 Elsevier Patient Education  2023 [employer]vier Inc.  

## 2023-03-13 ENCOUNTER — Encounter: Payer: Self-pay | Admitting: Physician Assistant

## 2023-04-02 ENCOUNTER — Other Ambulatory Visit: Payer: Self-pay | Admitting: Physician Assistant

## 2023-04-02 DIAGNOSIS — I10 Essential (primary) hypertension: Secondary | ICD-10-CM

## 2023-04-06 ENCOUNTER — Telehealth: Payer: Managed Care, Other (non HMO) | Admitting: Physician Assistant

## 2023-04-06 DIAGNOSIS — J4521 Mild intermittent asthma with (acute) exacerbation: Secondary | ICD-10-CM

## 2023-04-06 MED ORDER — PREDNISONE 20 MG PO TABS: 40.0000 mg | ORAL_TABLET | Freq: Every day | ORAL | 0 refills | Status: DC

## 2023-04-06 MED ORDER — ALBUTEROL SULFATE HFA 108 (90 BASE) MCG/ACT IN AERS: 2.0000 | INHALATION_SPRAY | Freq: Four times a day (QID) | RESPIRATORY_TRACT | 0 refills | Status: AC | PRN

## 2023-04-06 NOTE — Progress Notes (Signed)
E-Visit for Asthma  Based on what you have shared with me, it looks like you may have a flare up of allergic asthma.  Asthma symptoms vary from person to person, with common symptoms including nighttime awakening and decreased ability to participate in normal activities as a result of shortness of breath. It is often triggered by changes in weather, changes in the season, changes in air temperature, or inside (home, school, daycare or work) allergens such as animal dander, mold, mildew, woodstoves or cockroaches.   It can also be triggered by hormonal changes, extreme emotion, physical exertion or an upper respiratory tract illness.     It is important to identify the trigger, and then eliminate or avoid the trigger if possible.   If you have been prescribed medications to be taken on a regular basis, it is important to follow the asthma action plan and to follow guidelines to adjust medication in response to increasing symptoms of decreased peak expiratory flow rate  Treatment: I have prescribed: Albuterol (Proventil HFA; Ventolin HFA) 108 (90 Base) MCG/ACT Inhaler 2 puffs into the lungs every six hours as needed for wheezing or shortness of breath and Prednisone  by mouth per day for 5 - 7 days  HOME CARE Only take medications as instructed by your medical team. Consider wearing a mask or scarf to improve breathing air temperature have been shown to decrease irritation and decrease exacerbations Get rest. Taking a steamy shower or using a humidifier may help nasal congestion sand ease sore throat pain. You can place a towel over your head and breathe in the steam from hot water coming from a faucet. Using a saline nasal spray works much the same way.  Cough drops, hare candies and sore throat lozenges may ease your cough.  Avoid close contacts especially the very you and the  elderly Cover your mouth if you cough or sneeze Always remember to wash your hands.    GET HELP RIGHT AWAY IF: You develop worsening symptoms; breathlessness at rest, drowsy, confused or agitated, unable to speak in full sentences You have coughing fits You develop a severe headache or visual changes You develop shortness of breath, difficulty breathing or start having chest pain Your symptoms persist after you have completed your treatment plan If your symptoms do not improve within 10 days  MAKE SURE YOU Understand these instructions. Will watch your condition. Will get help right away if you are not doing well or get worse.   Your e-visit answers were reviewed by a board certified advanced clinical practitioner to complete your personal care plan, Depending upon the condition, your plan could have included both over the counter or prescription medications.   Please review your pharmacy choice. Your safety is important to [place]. If you have drug allergies check your prescription carefully.  You can use MyChart to ask questions about today's visit, request a non-urgent  call back, or ask for a work or school excuse for 24 hours related to this e-Visit. If it has been greater than 24 hours you will need to follow up with your provider, or enter a new e-Visit to address those concerns.   You will get an e-mail in the next two days asking about your experience. I hope that your e-visit has been valuable and will speed your recovery. Thank you for using e-visits.

## 2023-04-06 NOTE — Progress Notes (Signed)
I have spent 5 minutes in review of e-visit questionnaire, review and updating patient chart, medical decision making and response to patient.   Otelia Hettinger Cody Larri Yehle, PA-C    

## 2023-04-09 ENCOUNTER — Ambulatory Visit (HOSPITAL_BASED_OUTPATIENT_CLINIC_OR_DEPARTMENT_OTHER): Payer: Managed Care, Other (non HMO) | Attending: Physician Assistant | Admitting: Internal Medicine

## 2023-04-09 VITALS — Ht 70.0 in | Wt 295.0 lb

## 2023-04-09 DIAGNOSIS — G478 Other sleep disorders: Secondary | ICD-10-CM | POA: Insufficient documentation

## 2023-04-09 DIAGNOSIS — G4733 Obstructive sleep apnea (adult) (pediatric): Secondary | ICD-10-CM | POA: Insufficient documentation

## 2023-04-09 DIAGNOSIS — Z6841 Body Mass Index (BMI) 40.0 and over, adult: Secondary | ICD-10-CM | POA: Diagnosis not present

## 2023-04-09 DIAGNOSIS — R0683 Snoring: Secondary | ICD-10-CM | POA: Diagnosis present

## 2023-04-09 DIAGNOSIS — I10 Essential (primary) hypertension: Secondary | ICD-10-CM | POA: Insufficient documentation

## 2023-04-14 DIAGNOSIS — I10 Essential (primary) hypertension: Secondary | ICD-10-CM

## 2023-04-14 NOTE — Procedures (Signed)
   Patient Name: Dale Howard, Dale Howard Date: 04/09/2023 Gender: Male D.O.B: 11/02/1991 Age (years): 32 Referring Provider: Tandy Gaw Height (inches): 70 Interpreting Physician: Jetty Duhamel MD, ABSM Weight (lbs): 298 RPSGT: Swink Sink BMI: 43 MRN: [id number] Neck Size: 20.00  CLINICAL INFORMATION Sleep Study Type: HST Indication for sleep study: Excessive daytime somnolence Epworth Sleepiness Score: 13  SLEEP STUDY TECHNIQUE A multi-channel overnight portable sleep study was performed. The channels recorded were: nasal airflow, thoracic respiratory movement, and oxygen saturation with a pulse oximetry. Snoring was also monitored.  MEDICATIONS Patient self administered medications include: none reported.  SLEEP ARCHITECTURE Patient was studied for 359.9 minutes. The sleep efficiency was 100.0 % and the patient was supine for 0%. The arousal index was 0.0 per hour.  RESPIRATORY PARAMETERS The overall AHI was 42.0 per hour, with a central apnea index of 0 per hour. The oxygen nadir was 83% during sleep.  CARDIAC DATA Mean heart rate during sleep was 83.5 bpm.  IMPRESSIONS - Severe obstructive sleep apnea occurred during this study (AHI = 42.0/h). - Moderate oxygen desaturation was noted during this study (Min O2 = 83%, Mean 93%). - Patient snored.  DIAGNOSIS - Obstructive Sleep Apnea (G47.33)  RECOMMENDATIONS - Suggest CPAP titration sleep study or autopap. Other options would be based on clinical judgment. - Avoid alcohol, sedatives and other CNS depressants that may worsen sleep apnea and disrupt normal sleep architecture. - Sleep hygiene should be reviewed to assess factors that may improve sleep quality. - Weight management and regular exercise should be initiated or continued.  [Electronically signed] 04/14/2023 12:01 PM  Jetty Duhamel MD, ABSM Diplomate, American Board of Sleep Medicine NPI: [id number]                           Jetty Duhamel Diplomate, American Board of Sleep Medicine  ELECTRONICALLY SIGNED ON:  04/14/2023, 11:59 AM [facility] PH: [phone]   FX: [phone] ACCREDITED BY THE AMERICAN ACADEMY OF SLEEP MEDICINE

## 2023-04-16 ENCOUNTER — Encounter: Payer: Self-pay | Admitting: Family Medicine

## 2023-04-19 ENCOUNTER — Encounter: Payer: Self-pay | Admitting: Family Medicine

## 2023-04-19 ENCOUNTER — Telehealth (INDEPENDENT_AMBULATORY_CARE_PROVIDER_SITE_OTHER): Payer: Managed Care, Other (non HMO) | Admitting: Family Medicine

## 2023-04-19 DIAGNOSIS — G4733 Obstructive sleep apnea (adult) (pediatric): Secondary | ICD-10-CM | POA: Diagnosis not present

## 2023-04-19 NOTE — Progress Notes (Signed)
Spoke with patient he just wants to better understand how bad his sleep apnea is.

## 2023-04-19 NOTE — Progress Notes (Signed)
   Virtual Visit via Telephone Note  I connected with Dale Howard on 04/19/23 at  1:00 PM EDT by telephone and verified that I am speaking with the correct person using two identifiers.   I discussed the limitations, risks, security and privacy concerns of performing an evaluation and management service by telephone and the availability of in person appointments. I also discussed with the patient that there may be a patient responsible charge related to this service. The patient expressed understanding and agreed to proceed.  Patient location: in car Provider loccation: In office   Subjective:    CC:   Chief Complaint  Patient presents with   Results    HPI: Connecting today over virtual visit to go over recent sleep study results.  AHI was 42/h with a central apnea index of 0.  Oxygen nadir of 83% during sleep.  Mean heart rate 83.  Sleep study consistent with severe such as sleep apnea.  He does snore loudly.  He has daytime fatigue and falls asleep easily.   Past medical history, Surgical history, Family history not pertinant except as noted below, Social history, Allergies, and medications have been entered into the medical record, reviewed, and corrections made.   Review of Systems: No fevers, chills, night sweats, weight loss, chest pain, or shortness of breath.   Objective:    General: Speaking clearly in complete sentences without any shortness of breath.  Alert and oriented x3.  Normal judgment. No apparent acute distress.    Impression and Recommendations:    Problem List Items Addressed This Visit       Respiratory   OSA (obstructive sleep apnea) - Primary    Reviewed study results with him today and discussed treatment options.  He would be a great candidate for CPAP.  Will fax over order to set on [employer] and then plan to get a download in about 1 to 2 weeks after the machine has been delivered.  We can then set the CPAP to specific pressure and then have him  follow-up after a few weeks.  We discussed potential side effects of not treating his sleep apnea.  Will evaluate for symptom improvement at follow-up.      Relevant Orders   For home use only DME continuous positive airway pressure (CPAP)    No orders of the defined types were placed in this encounter.   No orders of the defined types were placed in this encounter.    I discussed the assessment and treatment plan with the patient. The patient was provided an opportunity to ask questions and all were answered. The patient agreed with the plan and demonstrated an understanding of the instructions.   The patient was advised to call back or seek an in-person evaluation if the symptoms worsen or if the condition fails to improve as anticipated.  I provided 20 minutes of non-face-to-face time during this encounter.   Nani Gasser, MD

## 2023-04-19 NOTE — Assessment & Plan Note (Signed)
Reviewed study results with him today and discussed treatment options.  He would be a great candidate for CPAP.  Will fax over order to set on [employer]Pap and then plan to get a download in about 1 to 2 weeks after the machine has been delivered.  We can then set the CPAP to specific pressure and then have him follow-up after a few weeks.  We discussed potential side effects of not treating his sleep apnea.  Will evaluate for symptom improvement at follow-up.

## 2023-04-20 ENCOUNTER — Telehealth: Payer: Self-pay | Admitting: *Deleted

## 2023-04-20 NOTE — Telephone Encounter (Signed)
Order for CPAP sent to [facility] confirmation received.

## 2023-05-02 ENCOUNTER — Telehealth: Payer: Self-pay

## 2023-05-02 NOTE — Transitions of Care (Post Inpatient/ED Visit) (Signed)
   05/02/2023  Name: Dale Howard MRN: [id number] DOB: 09/10/1991  Today's TOC FU Call Status: Today's TOC FU Call Status:: Successful TOC FU Call Competed TOC FU Call Complete Date: 05/02/23  Transition Care Management Follow-up Telephone Call Date of Discharge: 05/01/23 Discharge Facility: Other ([facility]) Name of Other ([facility]) Discharge Facility: [facility] Type of Discharge: Inpatient Admission Primary Inpatient Discharge Diagnosis:: diverticulitis How have you been since you were released from the hospital?: Better Any questions or concerns?: No  Items Reviewed: Did you receive and understand the discharge instructions provided?: Yes Medications obtained,verified, and reconciled?: Yes (Medications Reviewed) Any new allergies since your discharge?: No Dietary orders reviewed?: Yes Do you have support at home?: No  Medications Reviewed Today: Medications Reviewed Today     Reviewed by Karena Addison, LPN (Licensed Practical Nurse) on 05/02/23 at [phone]  Med List Status: <None>   Medication Order Taking? Sig Documenting Provider Last Dose Status Informant  albuterol (VENTOLIN HFA) 108 (90 Base) MCG/ACT inhaler [id number] Yes Inhale 2 puffs into the lungs every 6 (six) hours as needed for wheezing or shortness of breath. Waldon Merl, PA-C Taking Active   phentermine (ADIPEX-P) 37.5 MG tablet [id number] Yes One tab by mouth qAM Breeback, Jade L, PA-C Taking Active   SYRINGE-NEEDLE, DISP, 3 ML (BD SAFETYGLIDE NEEDLE) 21G X 1-1/2" 3 ML MISC [id number] No 1 Device by Does not apply route every 14 (fourteen) days. To use with testosterone  Patient not taking: Reported on 03/12/2023   Agapito Games, MD Not Taking Active   testosterone cypionate (DEPOTESTOSTERONE CYPIONATE) 200 MG/ML injection [id number] No Inject 1 mL (200 mg total) into the muscle every 14 (fourteen) days.  Patient not taking: Reported on 04/19/2023   Jomarie Longs, PA-C Not Taking Active    valsartan-hydrochlorothiazide (DIOVAN-HCT) 320-25 MG tablet [id number] Yes Take 1 tablet by mouth daily. Jomarie Longs, PA-C Taking Active             Home Care and Equipment/Supplies: Were Home Health Services Ordered?: NA Any new equipment or medical supplies ordered?: NA  Functional Questionnaire: Do you need assistance with bathing/showering or dressing?: No Do you need assistance with meal preparation?: No Do you need assistance with eating?: No Do you have difficulty maintaining continence: No Do you need assistance with getting out of bed/getting out of a chair/moving?: No Do you have difficulty managing or taking your medications?: No  Follow up appointments reviewed: PCP Follow-up appointment confirmed?: Yes Date of PCP follow-up appointment?: 05/07/23 Follow-up Provider: Tandy Gaw Specialist [facility] Follow-up appointment confirmed?: No Reason Specialist Follow-Up Not Confirmed: [facility] Calling Clinician Notified Provider Practice of Needed Appointment (gastroenterologist) Do you need transportation to your follow-up appointment?: No Do you understand care options if your condition(s) worsen?: Yes-patient verbalized understanding    SIGNATURE Karena Addison, LPN [facility] Nurse Health Advisor Direct Dial [phone]

## 2023-05-07 ENCOUNTER — Ambulatory Visit (INDEPENDENT_AMBULATORY_CARE_PROVIDER_SITE_OTHER): Payer: Managed Care, Other (non HMO) | Admitting: Physician Assistant

## 2023-05-07 VITALS — BP 132/92 | HR 66 | Ht 70.0 in | Wt 295.0 lb

## 2023-05-07 DIAGNOSIS — K5780 Diverticulitis of intestine, part unspecified, with perforation and abscess without bleeding: Secondary | ICD-10-CM

## 2023-05-07 DIAGNOSIS — E291 Testicular hypofunction: Secondary | ICD-10-CM | POA: Diagnosis not present

## 2023-05-07 DIAGNOSIS — G4733 Obstructive sleep apnea (adult) (pediatric): Secondary | ICD-10-CM

## 2023-05-07 DIAGNOSIS — K7689 Other specified diseases of liver: Secondary | ICD-10-CM

## 2023-05-07 DIAGNOSIS — Z09 Encounter for follow-up examination after completed treatment for conditions other than malignant neoplasm: Secondary | ICD-10-CM

## 2023-05-07 DIAGNOSIS — K5792 Diverticulitis of intestine, part unspecified, without perforation or abscess without bleeding: Secondary | ICD-10-CM | POA: Insufficient documentation

## 2023-05-07 MED ORDER — TESTOSTERONE CYPIONATE 200 MG/ML IM SOLN
200.0000 mg | INTRAMUSCULAR | 0 refills | Status: DC
Start: 2023-05-07 — End: 2023-11-01

## 2023-05-07 NOTE — Patient Instructions (Signed)
[facility] [phone] call in 2-3 days to confirm they are working on it.

## 2023-05-07 NOTE — Progress Notes (Unsigned)
Established Patient Office Visit  Subjective   Patient ID: Dale Howard, male    DOB: [birth year]  Age: 32 y.o. MRN: [id number]  Chief Complaint  Patient presents with   Hospitalization Follow-up    HPI Pt is a 32 yo obese male who presents to the clinic for hospital follow up on diverticular disease with associated abscess. He was admitted on 04/30/2023. He was discharged on 05/01/2023. He was given IV broad spectrum antibiotics and sent home on augmentin.   CT results:  IMPRESSION:  1.  Sigmoid diverticulosis with wall thickening and pericolonic stranding compatible with acute diverticulitis. 1.5 cm contained pericolonic fluid collection.   Liver: Fluid attenuation lesion at the right hepatic dome, favor benign cyst. Musculoskeletal: Disc degenerative changes most pronounced at L4-L5 and L5-S1.   He is feeling MUCH better today. He is still on augmentin and colace. His stools are soft and normal. His appetite has increased.   Never heard anything about CPAP.   He was never able to get testosterone due to insurance not covering.    Active Ambulatory Problems    Diagnosis Date Noted   Lumbar disc herniation 05/05/2016   Fatigue 05/05/2016   Low testosterone 05/19/2016   Elevated ALT measurement 05/19/2016   Vitamin D deficiency 05/19/2016   Essential hypertension, benign 06/05/2016   Elevated hemoglobin (HCC) 06/05/2016   Class 3 severe obesity due to excess calories without serious comorbidity with body mass index (BMI) of 40.0 to 44.9 in adult [facility]) 06/28/2016   Male hypogonadism 06/28/2016   Abnormal weight gain 06/28/2016   Drug-induced erectile dysfunction 10/13/2016   Low back pain radiating to left leg 02/06/2017   Bronchospasm 04/24/2019   Hypertension goal BP (blood pressure) < 130/80 04/24/2019   Allergic rhinitis with postnasal drip 04/24/2019   ACE-inhibitor cough 04/24/2019   Right lateral epicondylitis 09/28/2020   Extensor carpi ulnaris tendinitis  05/24/2021   Non-restorative sleep 05/24/2021   Gastroesophageal reflux disease without esophagitis 05/23/2022   OSA (obstructive sleep apnea) 04/19/2023   Diverticulitis 05/07/2023   Resolved Ambulatory Problems    Diagnosis Date Noted   Morbid obesity due to excess calories (HCC) 05/05/2016   Persistent cough for 3 weeks or longer 01/03/2018   Acute cervical sprain 01/03/2018   Closed displaced fracture of proximal phalanx of right middle finger 05/18/2016   Past Medical History:  Diagnosis Date   Hypertension    Hypogonadism in male    Obesity      ROS   See HPI.  Objective:     BP (!) 132/92   Pulse 66   Ht 5\' 10"  (1.778 m)   Wt 295 lb (133.8 kg)   SpO2 98%   BMI 42.33 kg/m  BP Readings from Last 3 Encounters:  05/07/23 (!) 132/92  03/12/23 (!) 142/86  06/21/22 125/75   Wt Readings from Last 3 Encounters:  05/07/23 295 lb (133.8 kg)  04/09/23 295 lb (133.8 kg)  03/12/23 298 lb (135.2 kg)      Physical Exam      Assessment & Plan:  Marland KitchenMarland KitchenJahson was seen today for hospitalization follow-up.  Diagnoses and all orders for this visit:  Diverticular disease of intestine with perforation and abscess -     Ambulatory referral to Gastroenterology  OSA (obstructive sleep apnea)  Male hypogonadism -     testosterone cypionate (DEPOTESTOSTERONE CYPIONATE) 200 MG/ML injection; Inject 1 mL (200 mg total) into the muscle every 14 (fourteen) days.  Hospital discharge follow-up  Liver cyst  Pt is stable today GI referral   Tandy Gaw, PA-C

## 2023-05-08 ENCOUNTER — Encounter: Payer: Self-pay | Admitting: Physician Assistant

## 2023-05-08 DIAGNOSIS — K7689 Other specified diseases of liver: Secondary | ICD-10-CM | POA: Insufficient documentation

## 2023-08-10 ENCOUNTER — Ambulatory Visit (INDEPENDENT_AMBULATORY_CARE_PROVIDER_SITE_OTHER): Payer: Managed Care, Other (non HMO) | Admitting: Physician Assistant

## 2023-08-10 VITALS — BP 146/92 | HR 117 | Ht 70.0 in | Wt 308.0 lb

## 2023-08-10 DIAGNOSIS — K5780 Diverticulitis of intestine, part unspecified, with perforation and abscess without bleeding: Secondary | ICD-10-CM | POA: Insufficient documentation

## 2023-08-10 DIAGNOSIS — E291 Testicular hypofunction: Secondary | ICD-10-CM

## 2023-08-10 DIAGNOSIS — Z6841 Body Mass Index (BMI) 40.0 and over, adult: Secondary | ICD-10-CM

## 2023-08-10 DIAGNOSIS — I10 Essential (primary) hypertension: Secondary | ICD-10-CM

## 2023-08-10 DIAGNOSIS — L304 Erythema intertrigo: Secondary | ICD-10-CM

## 2023-08-10 MED ORDER — VALSARTAN-HYDROCHLOROTHIAZIDE 320-25 MG PO TABS
1.0000 | ORAL_TABLET | Freq: Every day | ORAL | 3 refills | Status: DC
Start: 2023-08-10 — End: 2024-08-22

## 2023-08-10 MED ORDER — FLUCONAZOLE 150 MG PO TABS: 150.0000 mg | ORAL_TABLET | Freq: Once | ORAL | 0 refills | Status: AC

## 2023-08-10 MED ORDER — CLOTRIMAZOLE-BETAMETHASONE 1-0.05 % EX CREA
1.0000 | TOPICAL_CREAM | Freq: Two times a day (BID) | CUTANEOUS | 1 refills | Status: AC
Start: 2023-08-10 — End: ?

## 2023-08-10 NOTE — Patient Instructions (Signed)
Intertrigo Intertrigo is skin irritation (inflammation) that happens in warm, moist areas of the body. The irritation can cause a rash and make skin raw and itchy. The rash is usually pink or red. It happens mostly between folds of skin or where skin rubs together, such as: In the armpits. Under the breasts. Under the belly. In the groin area. Around the butt area. Between the toes. This condition is not passed from person to person. What are the causes? Heat, moisture, rubbing, and not enough air movement. The condition can be made worse by: Sweat. Bacteria. A fungus, such as yeast. What increases the risk? Moisture in your skin folds. You are more likely to develop this condition if you: Are not able to move around. Live in a warm and moist climate. Are not able to control your pee (urine) or poop (stool). Wear splints, braces, or other medical devices. Are overweight. Have diabetes. What are the signs or symptoms? A pink or red skin rash in a skin fold or near a skin fold. Raw or scaly skin. Itching. A burning feeling. Bleeding. Leaking fluid. A bad smell. How is this treated? Cleaning and drying your skin. Taking an antibiotic medicine or using an antibiotic skin cream for a bacterial infection. Using an antifungal cream on your skin or taking pills for an infection that was caused by a fungus, such as yeast. Using a steroid ointment to stop the itching and irritation. Separating the skin fold with a clean cotton cloth to absorb moisture and allow air to flow into the area. Follow these instructions at home: Keep the affected area clean and dry. Do not scratch your skin. Stay cool as much as you can. Use an air conditioner or a fan, if you have one. Apply over-the-counter and prescription medicines only as told by your doctor. If you were prescribed antibiotics, use them as told by your doctor. Do not stop using the antibiotic even if you start to feel better. Keep all  follow-up visits. Your doctor may need to check your skin to make sure that the treatment is working. How is this prevented? Shower and dry yourself well after being active. Use a hair dryer on a cool setting to dry between skin folds. Do not wear tight clothes. Wear clothes that: Are loose. Take moisture away from your body. Are made of cotton. Wear a bra that gives good support, if needed. Protect the skin in your groin and butt area as told by your doctor. To do this: Follow a regular cleaning routine. Use creams, powders, or ointments that protect your skin. Change protection pads often. Stay at a healthy weight. Take care of your feet. This is very important if you have diabetes. You should: Wear shoes that fit well. Keep your feet dry. Wear clean cotton or wool socks. Keep your blood sugar under control if you have diabetes. Contact a doctor if: Your symptoms do not get better with treatment. Your symptoms get worse or they spread. You notice more redness and warmth. You have a fever. This information is not intended to replace advice given to you by your health care provider. Make sure you discuss any questions you have with your health care provider. Document Revised: 04/27/2022 Document Reviewed: 04/27/2022 Elsevier Patient Education  2024 Elsevier Inc.  

## 2023-08-10 NOTE — Progress Notes (Unsigned)
Established Patient Office Visit  Subjective   Patient ID: Dale Howard, male    DOB: 1991-06-08  Age: 32 y.o. MRN: [id number]  Chief Complaint  Patient presents with   Medical Management of Chronic Issues    3 mo fup htn , colonoscopy results, rash  under stomach arm pits and buttocks    HPI Pt is a 32 yo obese male with HTN who presents to the clinic for follow up and medication refills.   He did have perforated colon and abscess in May. He is feeling better and had colonoscopy this morning. He is seen by [facility].   He takes his medication for BP daily but did not this morning. BP this morning at [facility] was 130/86. No CP, palpitations, headaches or vision changes.   He does have red irritating skin rash of his skin folds. He is putting OTC creams but not helping get rid of it.    .. Active Ambulatory Problems    Diagnosis Date Noted   Lumbar disc herniation 05/05/2016   Fatigue 05/05/2016   Low testosterone 05/19/2016   Elevated ALT measurement 05/19/2016   Vitamin D deficiency 05/19/2016   Essential hypertension, benign 06/05/2016   Elevated hemoglobin (HCC) 06/05/2016   Class 3 severe obesity due to excess calories without serious comorbidity with body mass index (BMI) of 40.0 to 44.9 in adult [facility]) 06/28/2016   Male hypogonadism 06/28/2016   Abnormal weight gain 06/28/2016   Drug-induced erectile dysfunction 10/13/2016   Low back pain radiating to left leg 02/06/2017   Bronchospasm 04/24/2019   Hypertension goal BP (blood pressure) < 130/80 04/24/2019   Allergic rhinitis with postnasal drip 04/24/2019   ACE-inhibitor cough 04/24/2019   Right lateral epicondylitis 09/28/2020   Extensor carpi ulnaris tendinitis 05/24/2021   Non-restorative sleep 05/24/2021   Gastroesophageal reflux disease without esophagitis 05/23/2022   OSA (obstructive sleep apnea) 04/19/2023   Diverticulitis 05/07/2023   Liver cyst 05/08/2023   Diverticular disease of  intestine with perforation and abscess 08/10/2023   Intertrigo 08/10/2023   Resolved Ambulatory Problems    Diagnosis Date Noted   Morbid obesity due to excess calories (HCC) 05/05/2016   Persistent cough for 3 weeks or longer 01/03/2018   Acute cervical sprain 01/03/2018   Closed displaced fracture of proximal phalanx of right middle finger 05/18/2016   Past Medical History:  Diagnosis Date   Hypertension    Hypogonadism in male    Obesity      ROS See HPI.    Objective:     BP (!) 146/92   Pulse (!) 117   Ht 5\' 10"  (1.778 m)   Wt (!) 308 lb (139.7 kg)   SpO2 99%   BMI 44.19 kg/m  BP Readings from Last 3 Encounters:  08/10/23 (!) 146/92  05/07/23 (!) 132/92  03/12/23 (!) 142/86   Wt Readings from Last 3 Encounters:  08/10/23 (!) 308 lb (139.7 kg)  05/07/23 295 lb (133.8 kg)  04/09/23 295 lb (133.8 kg)      Physical Exam Constitutional:      Appearance: Normal appearance. He is obese.  HENT:     Head: Normocephalic.  Cardiovascular:     Rate and Rhythm: Normal rate and regular rhythm.     Heart sounds: Normal heart sounds.  Pulmonary:     Effort: Pulmonary effort is normal.     Breath sounds: Normal breath sounds.  Abdominal:     General: There is no distension.  Palpations: Abdomen is soft. There is no mass.     Tenderness: There is no abdominal tenderness. There is no right CVA tenderness, left CVA tenderness or guarding.     Hernia: No hernia is present.  Musculoskeletal:     Right lower leg: No edema.     Left lower leg: No edema.  Skin:    Comments: Erythematous rash under skin folds of abdomen, groin, gluteal crease.   Neurological:     General: No focal deficit present.     Mental Status: He is alert and oriented to person, place, and time.  Psychiatric:        Mood and Affect: Mood normal.        Assessment & Plan:  Marland KitchenMarland KitchenDavidlee was seen today for medical management of chronic issues.  Diagnoses and all orders for this  visit:  Diverticular disease of intestine with perforation and abscess  Male hypogonadism  Hypertension goal BP (blood pressure) < 130/80  Intertrigo -     fluconazole (DIFLUCAN) 150 MG tablet; Take 1 tablet (150 mg total) by mouth once for 1 dose. -     clotrimazole-betamethasone (LOTRISONE) cream; Apply 1 Application topically 2 (two) times daily.  Class 3 severe obesity due to excess calories without serious comorbidity with body mass index (BMI) of 40.0 to 44.9 in adult [facility])  Essential hypertension, benign -     valsartan-hydrochlorothiazide (DIOVAN-HCT) 320-25 MG tablet; Take 1 tablet by mouth daily.   BP just a little elevated but has not had medication and was to goal this morning when he should have taken another dose.  Refilled Diovan/HcT.   Continue to follow up with [facility] and keep stools normal without constipation.   Rash appears like yeast  Diflucan given and combination cream Keep area dry Follow up as needed or if symptoms persist or worsen  Continue to work on weight loss    Tandy Gaw, PA-C

## 2023-08-13 ENCOUNTER — Encounter: Payer: Self-pay | Admitting: Physician Assistant

## 2023-08-15 ENCOUNTER — Encounter: Payer: Self-pay | Admitting: Physician Assistant

## 2023-08-15 ENCOUNTER — Ambulatory Visit (INDEPENDENT_AMBULATORY_CARE_PROVIDER_SITE_OTHER): Payer: Managed Care, Other (non HMO) | Admitting: Physician Assistant

## 2023-08-15 VITALS — BP 139/86 | HR 91 | Ht 71.0 in | Wt 301.0 lb

## 2023-08-15 DIAGNOSIS — L299 Pruritus, unspecified: Secondary | ICD-10-CM | POA: Diagnosis not present

## 2023-08-15 DIAGNOSIS — T50905A Adverse effect of unspecified drugs, medicaments and biological substances, initial encounter: Secondary | ICD-10-CM

## 2023-08-15 DIAGNOSIS — R21 Rash and other nonspecific skin eruption: Secondary | ICD-10-CM

## 2023-08-15 MED ORDER — HYDROXYZINE HCL 10 MG PO TABS
10.0000 mg | ORAL_TABLET | Freq: Three times a day (TID) | ORAL | 0 refills | Status: DC | PRN
Start: 2023-08-15 — End: 2024-01-18

## 2023-08-15 MED ORDER — METHYLPREDNISOLONE SODIUM SUCC 125 MG IJ SOLR
125.0000 mg | Freq: Once | INTRAMUSCULAR | Status: AC
Start: 2023-08-15 — End: 2023-08-15
  Administered 2023-08-15: 125 mg via INTRAMUSCULAR

## 2023-08-15 MED ORDER — PREDNISONE 20 MG PO TABS: ORAL_TABLET | ORAL | 0 refills | Status: DC

## 2023-08-15 NOTE — Progress Notes (Signed)
Acute Office Visit  Subjective:     Patient ID: Dale Howard, male    DOB: 1991-03-03, 32 y.o.   MRN: [id number]  Chief Complaint  Patient presents with   Rash    Rash over entire body     HPI Patient is in today for itchy rash that is on his trunk and legs. He had yeast dermatitis that was treated Friday with diflucan and topical. His intertrigo rash improved but broke out in an itchy rash all over upper legs and trunk. He denies any fever or chills. No problems swallowing or breathing. He took another diflucan on Sunday and rash worsened again.   .. Active Ambulatory Problems    Diagnosis Date Noted   Lumbar disc herniation 05/05/2016   Fatigue 05/05/2016   Low testosterone 05/19/2016   Elevated ALT measurement 05/19/2016   Vitamin D deficiency 05/19/2016   Essential hypertension, benign 06/05/2016   Elevated hemoglobin (HCC) 06/05/2016   Class 3 severe obesity due to excess calories without serious comorbidity with body mass index (BMI) of 40.0 to 44.9 in adult [facility]) 06/28/2016   Male hypogonadism 06/28/2016   Abnormal weight gain 06/28/2016   Drug-induced erectile dysfunction 10/13/2016   Low back pain radiating to left leg 02/06/2017   Bronchospasm 04/24/2019   Hypertension goal BP (blood pressure) < 130/80 04/24/2019   Allergic rhinitis with postnasal drip 04/24/2019   ACE-inhibitor cough 04/24/2019   Right lateral epicondylitis 09/28/2020   Extensor carpi ulnaris tendinitis 05/24/2021   Non-restorative sleep 05/24/2021   Gastroesophageal reflux disease without esophagitis 05/23/2022   OSA (obstructive sleep apnea) 04/19/2023   Diverticulitis 05/07/2023   Liver cyst 05/08/2023   Diverticular disease of intestine with perforation and abscess 08/10/2023   Intertrigo 08/10/2023   Resolved Ambulatory Problems    Diagnosis Date Noted   Morbid obesity due to excess calories (HCC) 05/05/2016   Persistent cough for 3 weeks or longer 01/03/2018   Acute cervical sprain  01/03/2018   Closed displaced fracture of proximal phalanx of right middle finger 05/18/2016   Past Medical History:  Diagnosis Date   Hypertension    Hypogonadism in male    Obesity      ROS  See HPI.     Objective:    BP 139/86   Pulse 91   Ht 5\' 11"  (1.803 m)   Wt (!) 301 lb (136.5 kg)   SpO2 96%   BMI 41.98 kg/m  BP Readings from Last 3 Encounters:  08/15/23 139/86  08/10/23 (!) 146/92  05/07/23 (!) 132/92   Wt Readings from Last 3 Encounters:  08/15/23 (!) 301 lb (136.5 kg)  08/10/23 (!) 308 lb (139.7 kg)  05/07/23 295 lb (133.8 kg)      Physical Exam Slightly raised erythematous patches to macules over bilateral upper thighs and trunk.      Assessment & Plan:  Marland KitchenMarland KitchenLeopold was seen today for rash.  Diagnoses and all orders for this visit:  Adverse effect of drug, initial encounter -     predniSONE (DELTASONE) 20 MG tablet; Take 3 tablets for 3 days, take 2 tablets for 3 days, take 1 tablet for 3 days, take 1/2 tablet for 4 days. -     hydrOXYzine (ATARAX) 10 MG tablet; Take 1 tablet (10 mg total) by mouth 3 (three) times daily as needed. -     methylPREDNISolone sodium succinate (SOLU-MEDROL) 125 mg/2 mL injection 125 mg  Rash -     predniSONE (DELTASONE) 20 MG tablet; Take  3 tablets for 3 days, take 2 tablets for 3 days, take 1 tablet for 3 days, take 1/2 tablet for 4 days. -     hydrOXYzine (ATARAX) 10 MG tablet; Take 1 tablet (10 mg total) by mouth 3 (three) times daily as needed. -     methylPREDNISolone sodium succinate (SOLU-MEDROL) 125 mg/2 mL injection 125 mg  Itching -     predniSONE (DELTASONE) 20 MG tablet; Take 3 tablets for 3 days, take 2 tablets for 3 days, take 1 tablet for 3 days, take 1/2 tablet for 4 days. -     hydrOXYzine (ATARAX) 10 MG tablet; Take 1 tablet (10 mg total) by mouth 3 (three) times daily as needed. -     methylPREDNISolone sodium succinate (SOLU-MEDROL) 125 mg/2 mL injection 125 mg   Intertrigo appears to be  improving Discussed drug reaction rash to likely diflucan Added to allergy list Solumedrol given today Prednisone taper sent to pharmacy Atarax as needed for itching Cool compresses Follow up as needed or if symptoms change or worsen  Tandy Gaw, PA-C

## 2023-08-15 NOTE — Patient Instructions (Signed)
Drug Rash  A drug rash occurs when a medicine causes a change in the color or texture of the skin. It can develop minutes, hours, or days after you take the medicine. The rash may appear on a small area of skin or all over your body. What are the causes? This condition may be caused by one of these three conditions: An allergic reaction to the medicine. An unwanted side effect of a certain medicine. Extreme sensitivity to sunlight caused by the medicine. What increases the risk? If you take any of these medicines that make your skin sensitive to light and are exposed to sunlight, it can make you more likely to develop this condition: Antibiotics, including tetracyclines and sulfa medicines. Antifungals. Antihistamines. Diuretics. Retinoids, such as isotretinoin. Statins. NSAIDs. What are the signs or symptoms? Symptoms of this condition include: Redness. Tiny bumps. Peeling. Itching. Itchy welts (hives). Swelling. How is this diagnosed? This condition may be diagnosed based on: A physical exam. Tests to find out which medicine caused the rash. These tests may include: Skin tests. Blood tests. How is this treated? This condition is treated with medicines, including: Antihistamine. This may be given to relieve itching. NSAIDs. These may be given to reduce swelling and to treat pain. A steroid medicine. This may be given to reduce swelling. The rash usually goes away when you stop taking the medicine that caused it. Follow these instructions at home: Take over-the-counter and prescription medicines only as told by your health care provider. Tell all your health care providers about any medicine reactions that you have had in the past. If your rash was caused by sensitivity to sunlight, and while your rash is healing: Avoid being in the sun if possible, especially when it is strongest, usually between 10 a.m. and 4 p.m. Cover your skin with pants, long sleeves, and a hat when  you are exposed to sunlight. If you have hives: Take a cool shower or use a cool compress to relieve itchiness. Take over-the-counter antihistamines, as recommended by your health care provider, until the hives are gone. Hives are not contagious. Keep all follow-up visits. This is important. Contact a health care provider if: You have fever. Your rash is not going away. Your rash gets worse. Your rash comes back. You have high-pitched whistling sounds when you breathe, most often when you breathe out (wheezing) or coughing. Get help right away if: You start to have breathing problems. You start to have shortness of breath. Your face or throat starts to swell. You have severe weakness with dizziness or fainting. You have chest pain. Your skin starts to blister and peel. These symptoms may represent a serious problem that is an emergency. Do not wait to see if the symptoms will go away. Get medical help right away. Call your local emergency services (911 in the [place] Do not drive yourself to the hospital. Summary A drug rash occurs when a medicine causes a change in the color or texture of the skin. The rash may appear on a small area of skin or all over your body. It can develop minutes, hours, or days after you take the medicine. Your health care provider will do various tests to determine what medicine caused your rash. The rash may be treated with medicine to relieve itching, swelling, and pain. This information is not intended to replace advice given to you by your health care provider. Make sure you discuss any questions you have with your health care provider. Document Revised: 05/16/2021  Document Reviewed: 05/16/2021 Elsevier Patient Education  2024 [employer].

## 2023-09-17 ENCOUNTER — Ambulatory Visit: Payer: Managed Care, Other (non HMO) | Admitting: Physician Assistant

## 2023-11-01 ENCOUNTER — Other Ambulatory Visit: Payer: Self-pay | Admitting: Physician Assistant

## 2023-11-01 DIAGNOSIS — E291 Testicular hypofunction: Secondary | ICD-10-CM

## 2023-11-02 MED ORDER — TESTOSTERONE CYPIONATE 200 MG/ML IM SOLN
200.0000 mg | INTRAMUSCULAR | 0 refills | Status: DC
Start: 2023-11-02 — End: 2024-01-18

## 2023-11-02 NOTE — Telephone Encounter (Signed)
Called patient informed of medication being sent and that labs are ordered, thanks.

## 2024-01-04 ENCOUNTER — Ambulatory Visit: Payer: Managed Care, Other (non HMO) | Admitting: Physician Assistant

## 2024-01-11 ENCOUNTER — Ambulatory Visit: Payer: Managed Care, Other (non HMO) | Admitting: Physician Assistant

## 2024-01-18 ENCOUNTER — Telehealth: Payer: Self-pay

## 2024-01-18 ENCOUNTER — Ambulatory Visit (INDEPENDENT_AMBULATORY_CARE_PROVIDER_SITE_OTHER): Payer: Managed Care, Other (non HMO) | Admitting: Physician Assistant

## 2024-01-18 VITALS — BP 160/120 | HR 75 | Ht 70.0 in | Wt 311.0 lb

## 2024-01-18 DIAGNOSIS — G4733 Obstructive sleep apnea (adult) (pediatric): Secondary | ICD-10-CM

## 2024-01-18 DIAGNOSIS — I10 Essential (primary) hypertension: Secondary | ICD-10-CM | POA: Diagnosis not present

## 2024-01-18 DIAGNOSIS — Z79899 Other long term (current) drug therapy: Secondary | ICD-10-CM

## 2024-01-18 DIAGNOSIS — Z13220 Encounter for screening for lipoid disorders: Secondary | ICD-10-CM | POA: Diagnosis not present

## 2024-01-18 DIAGNOSIS — E291 Testicular hypofunction: Secondary | ICD-10-CM | POA: Diagnosis not present

## 2024-01-18 MED ORDER — AMLODIPINE BESYLATE 2.5 MG PO TABS: 2.5000 mg | ORAL_TABLET | Freq: Every day | ORAL | 0 refills | Status: DC

## 2024-01-18 MED ORDER — TESTOSTERONE CYPIONATE 200 MG/ML IM SOLN: 200.0000 mg | INTRAMUSCULAR | 1 refills | Status: DC

## 2024-01-18 NOTE — Progress Notes (Unsigned)
Established Patient Office Visit  Subjective   Patient ID: Dale Howard, male    DOB: June 30, 1991  Age: 33 y.o. MRN: [id number]  Chief Complaint  Patient presents with   Medical Management of Chronic Issues    Osa fup, left ear infection check    HPI Pt is a 33 yo obese male who presents to the clinic for follow up.   He never got CPAP from sleep study in April of last year.  His testosterone was not refilled last visit and been out for over a month.  He is not checking BP at home. He admits to taking diovan/HcT daily. Denies any CP, palpitations, headaches or vision changes.   Patient Active Problem List   Diagnosis Date Noted   Uncontrolled hypertension 01/18/2024   Diverticular disease of intestine with perforation and abscess 08/10/2023   Intertrigo 08/10/2023   Liver cyst 05/08/2023   Diverticulitis 05/07/2023   OSA (obstructive sleep apnea) 04/19/2023   Gastroesophageal reflux disease without esophagitis 05/23/2022   Extensor carpi ulnaris tendinitis 05/24/2021   Non-restorative sleep 05/24/2021   Right lateral epicondylitis 09/28/2020   Bronchospasm 04/24/2019   Hypertension goal BP (blood pressure) < 130/80 04/24/2019   Allergic rhinitis with postnasal drip 04/24/2019   ACE-inhibitor cough 04/24/2019   Low back pain radiating to left leg 02/06/2017   Drug-induced erectile dysfunction 10/13/2016   Class 3 severe obesity due to excess calories without serious comorbidity with body mass index (BMI) of 40.0 to 44.9 in adult [facility]) 06/28/2016   Male hypogonadism 06/28/2016   Abnormal weight gain 06/28/2016   Essential hypertension, benign 06/05/2016   Elevated hemoglobin (HCC) 06/05/2016   Low testosterone 05/19/2016   Elevated ALT measurement 05/19/2016   Vitamin D deficiency 05/19/2016   Lumbar disc herniation 05/05/2016   Fatigue 05/05/2016   Past Medical History:  Diagnosis Date   Hypertension    Hypogonadism in male    Obesity    History reviewed. No  pertinent surgical history. Family History  Problem Relation Age of Onset   Heart attack Maternal Grandmother    Hypertension Maternal Grandmother    Hyperlipidemia Maternal Grandmother    Allergies  Allergen Reactions   Diflucan [Fluconazole]     Drug reaction rash   Penicillins Other (See Comments)    Unknown, childhood    ROS See HPI.    Objective:     BP (!) 160/120   Pulse 75   Ht 5\' 10"  (1.778 m)   Wt (!) 311 lb (141.1 kg)   SpO2 99%   BMI 44.62 kg/m  BP Readings from Last 3 Encounters:  01/18/24 (!) 160/120  08/15/23 139/86  08/10/23 (!) 146/92   Wt Readings from Last 3 Encounters:  01/18/24 (!) 311 lb (141.1 kg)  08/15/23 (!) 301 lb (136.5 kg)  08/10/23 (!) 308 lb (139.7 kg)      Physical Exam Vitals reviewed.  Constitutional:      Appearance: Normal appearance. He is obese.  HENT:     Head: Normocephalic.  Cardiovascular:     Rate and Rhythm: Normal rate and regular rhythm.  Pulmonary:     Effort: Pulmonary effort is normal.  Musculoskeletal:     Right lower leg: No edema.     Left lower leg: No edema.  Neurological:     General: No focal deficit present.     Mental Status: He is alert and oriented to person, place, and time.  Psychiatric:        Mood  and Affect: Mood normal.          Assessment & Plan:  Marland KitchenMarland KitchenEyoel was seen today for medical management of chronic issues.  Diagnoses and all orders for this visit:  Uncontrolled hypertension -     amLODipine (NORVASC) 2.5 MG tablet; Take 1 tablet (2.5 mg total) by mouth daily. -     For home use only DME continuous positive airway pressure (CPAP)  Male hypogonadism -     testosterone cypionate (DEPOTESTOSTERONE CYPIONATE) 200 MG/ML injection; Inject 1 mL (200 mg total) into the muscle every 14 (fourteen) days. -     Testosterone -     PSA -     CBC w/Diff/Platelet  Screening for lipid disorders -     Lipid panel  Medication management -     Testosterone -     CMP14+EGFR -      PSA -     CBC w/Diff/Platelet -     Lipid panel  OSA (obstructive sleep apnea) -     For home use only DME continuous positive airway pressure (CPAP)   Restart testosterone every 2 weeks and check labs in the in between week  BP not to goal and patient is asymptomatic Discussed how important it is to control BP Start norvasc 2.5mg  with Diovan HCT Recheck BP nurse visit in 2 weeks Discussed diet changes Keep BP log at home  Treating OSA will help Reordered CPAP machine    Tandy Gaw, PA-C

## 2024-01-18 NOTE — Patient Instructions (Signed)
Ordered CPaP again Get labs in 3 months 2 weeks BP recheck, start norvasc 2.5mg  daily with current medications  Hypertension, Adult High blood pressure (hypertension) is when the force of blood pumping through the arteries is too strong. The arteries are the blood vessels that carry blood from the heart throughout the body. Hypertension forces the heart to work harder to pump blood and may cause arteries to become narrow or stiff. Untreated or uncontrolled hypertension can lead to a heart attack, heart failure, a stroke, kidney disease, and other problems. A blood pressure reading consists of a higher number over a lower number. Ideally, your blood pressure should be below 120/80. The first ("top") number is called the systolic pressure. It is a measure of the pressure in your arteries as your heart beats. The second ("bottom") number is called the diastolic pressure. It is a measure of the pressure in your arteries as the heart relaxes. What are the causes? The exact cause of this condition is not known. There are some conditions that result in high blood pressure. What increases the risk? Certain factors may make you more likely to develop high blood pressure. Some of these risk factors are under your control, including: Smoking. Not getting enough exercise or physical activity. Being overweight. Having too much fat, sugar, calories, or salt (sodium) in your diet. Drinking too much alcohol. Other risk factors include: Having a personal history of heart disease, diabetes, high cholesterol, or kidney disease. Stress. Having a family history of high blood pressure and high cholesterol. Having obstructive sleep apnea. Age. The risk increases with age. What are the signs or symptoms? High blood pressure may not cause symptoms. Very high blood pressure (hypertensive crisis) may cause: Headache. Fast or irregular heartbeats (palpitations). Shortness of breath. Nosebleed. Nausea and  vomiting. Vision changes. Severe chest pain, dizziness, and seizures. How is this diagnosed? This condition is diagnosed by measuring your blood pressure while you are seated, with your arm resting on a flat surface, your legs uncrossed, and your feet flat on the floor. The cuff of the blood pressure monitor will be placed directly against the skin of your upper arm at the level of your heart. Blood pressure should be measured at least twice using the same arm. Certain conditions can cause a difference in blood pressure between your right and left arms. If you have a high blood pressure reading during one visit or you have normal blood pressure with other risk factors, you may be asked to: Return on a different day to have your blood pressure checked again. Monitor your blood pressure at home for 1 week or longer. If you are diagnosed with hypertension, you may have other blood or imaging tests to help your health care provider understand your overall risk for other conditions. How is this treated? This condition is treated by making healthy lifestyle changes, such as eating healthy foods, exercising more, and reducing your alcohol intake. You may be referred for counseling on a healthy diet and physical activity. Your health care provider may prescribe medicine if lifestyle changes are not enough to get your blood pressure under control and if: Your systolic blood pressure is above 130. Your diastolic blood pressure is above 80. Your personal target blood pressure may vary depending on your medical conditions, your age, and other factors. Follow these instructions at home: Eating and drinking  Eat a diet that is high in fiber and potassium, and low in sodium, added sugar, and fat. An example of this  eating plan is called the DASH diet. DASH stands for Dietary Approaches to Stop Hypertension. To eat this way: Eat plenty of fresh fruits and vegetables. Try to fill one half of your plate at each  meal with fruits and vegetables. Eat whole grains, such as whole-wheat pasta, brown rice, or whole-grain bread. Fill about one fourth of your plate with whole grains. Eat or drink low-fat dairy products, such as skim milk or low-fat yogurt. Avoid fatty cuts of meat, processed or cured meats, and poultry with skin. Fill about one fourth of your plate with lean proteins, such as fish, chicken without skin, beans, eggs, or tofu. Avoid pre-made and processed foods. These tend to be higher in sodium, added sugar, and fat. Reduce your daily sodium intake. Many people with hypertension should eat less than 1,500 mg of sodium a day. Do not drink alcohol if: Your health care provider tells you not to drink. You are pregnant, may be pregnant, or are planning to become pregnant. If you drink alcohol: Limit how much you have to: 0-1 drink a day for women. 0-2 drinks a day for men. Know how much alcohol is in your drink. In the [place] one drink equals one 12 oz bottle of beer (355 mL), one 5 oz glass of wine (148 mL), or one 1 oz glass of hard liquor (44 mL). Lifestyle  Work with your health care provider to maintain a healthy body weight or to lose weight. Ask what an ideal weight is for you. Get at least 30 minutes of exercise that causes your heart to beat faster (aerobic exercise) most days of the week. Activities may include walking, swimming, or biking. Include exercise to strengthen your muscles (resistance exercise), such as Pilates or lifting weights, as part of your weekly exercise routine. Try to do these types of exercises for 30 minutes at least 3 days a week. Do not use any products that contain nicotine or tobacco. These products include cigarettes, chewing tobacco, and vaping devices, such as e-cigarettes. If you need help quitting, ask your health care provider. Monitor your blood pressure at home as told by your health care provider. Keep all follow-up visits. This is  important. Medicines Take over-the-counter and prescription medicines only as told by your health care provider. Follow directions carefully. Blood pressure medicines must be taken as prescribed. Do not skip doses of blood pressure medicine. Doing this puts you at risk for problems and can make the medicine less effective. Ask your health care provider about side effects or reactions to medicines that you should watch for. Contact a health care provider if you: Think you are having a reaction to a medicine you are taking. Have headaches that keep coming back (recurring). Feel dizzy. Have swelling in your ankles. Have trouble with your vision. Get help right away if you: Develop a severe headache or confusion. Have unusual weakness or numbness. Feel faint. Have severe pain in your chest or abdomen. Vomit repeatedly. Have trouble breathing. These symptoms may be an emergency. Get help right away. Call 911. Do not wait to see if the symptoms will go away. Do not drive yourself to the hospital. Summary Hypertension is when the force of blood pumping through your arteries is too strong. If this condition is not controlled, it may put you at risk for serious complications. Your personal target blood pressure may vary depending on your medical conditions, your age, and other factors. For most people, a normal blood pressure is less than 120/80.  Hypertension is treated with lifestyle changes, medicines, or a combination of both. Lifestyle changes include losing weight, eating a healthy, low-sodium diet, exercising more, and limiting alcohol. This information is not intended to replace advice given to you by your health care provider. Make sure you discuss any questions you have with your health care provider. Document Revised: 10/11/2021 Document Reviewed: 10/11/2021 Elsevier Patient Education  2024 [employer].

## 2024-01-18 NOTE — Telephone Encounter (Signed)
Initiated Prior authorization ZOX:[id number] Cypionate 200MG /ML intramuscular solution Via: Covermymeds Case/Key:[id number] Status: n/a as of 01/18/24 Reason:Drug is covered by current benefit plan. No further PA activity needed Notified Pt via: Mychart  Called pharmacy  Co-pay is 91.17 for 90 day supply 31.24, for 30 day supply  Medication is ready for pick up  Called pt to notify.

## 2024-01-21 ENCOUNTER — Encounter: Payer: Self-pay | Admitting: Physician Assistant

## 2024-02-01 ENCOUNTER — Ambulatory Visit (INDEPENDENT_AMBULATORY_CARE_PROVIDER_SITE_OTHER): Payer: Managed Care, Other (non HMO) | Admitting: Physician Assistant

## 2024-02-01 VITALS — BP 150/92 | HR 81 | Ht 70.0 in | Wt 315.0 lb

## 2024-02-01 DIAGNOSIS — I10 Essential (primary) hypertension: Secondary | ICD-10-CM

## 2024-02-01 MED ORDER — AMLODIPINE BESYLATE 5 MG PO TABS: 5.0000 mg | ORAL_TABLET | Freq: Every day | ORAL | 0 refills | Status: DC

## 2024-02-01 NOTE — Progress Notes (Signed)
Pt presents to clinic today for BP check.  He is taking medications as directed with no problems.   Denies any cp/sob/palpitaions/headaches/dizziness or swelling.   First check of pt's blood pressure was elevated. I had him to sit before rechecking.   Pt's second BP check was still elevated.   Pt advised that Lesly Rubenstein would be increasing his Amlodipine to 5 mg daily and that we would send that over to his pharmacy today. He will need to RTC in 4 weeks for BP check with nurse.

## 2024-02-01 NOTE — Progress Notes (Signed)
See attestation

## 2024-02-06 ENCOUNTER — Encounter: Payer: Self-pay | Admitting: Physician Assistant

## 2024-02-11 ENCOUNTER — Telehealth: Payer: Self-pay | Admitting: Physician Assistant

## 2024-02-11 NOTE — Telephone Encounter (Signed)
 Copied from CRM [id number]. Topic: Referral - Status >> Feb 11, 2024 11:22 AM Nila Nephew wrote: Reason for CRM: Mardelle Matte with [facility] calling back to [place] to speak with her regarding this patient. Please give a call back at [phone].

## 2024-02-12 NOTE — Telephone Encounter (Signed)
 Spoke with Dale Howard- [facility], awaiting update on order status. Dale Howard states orders have been received and team is contacting patients insurance.

## 2024-02-15 ENCOUNTER — Other Ambulatory Visit: Payer: Self-pay | Admitting: Physician Assistant

## 2024-02-15 DIAGNOSIS — I10 Essential (primary) hypertension: Secondary | ICD-10-CM

## 2024-02-20 NOTE — Telephone Encounter (Signed)
 Received incoming call from [facility] requesting sleep study interpretation to be faxed over to their office. Requested information has been faxed to Arrie Aran at [phone].

## 2024-02-29 ENCOUNTER — Ambulatory Visit: Payer: Managed Care, Other (non HMO)

## 2024-03-11 ENCOUNTER — Other Ambulatory Visit: Payer: Self-pay | Admitting: Physician Assistant

## 2024-03-11 DIAGNOSIS — I10 Essential (primary) hypertension: Secondary | ICD-10-CM

## 2024-03-31 ENCOUNTER — Other Ambulatory Visit: Payer: Self-pay | Admitting: Family Medicine

## 2024-06-24 ENCOUNTER — Encounter: Payer: Self-pay | Admitting: Physician Assistant

## 2024-06-27 ENCOUNTER — Ambulatory Visit: Payer: Self-pay | Admitting: Physician Assistant

## 2024-06-27 DIAGNOSIS — E291 Testicular hypofunction: Secondary | ICD-10-CM

## 2024-06-27 LAB — CBC WITH DIFFERENTIAL/PLATELET
Basophils Absolute: 0 x10E3/uL (ref 0.0–0.2)
Basos: 0 %
EOS (ABSOLUTE): 0.3 x10E3/uL (ref 0.0–0.4)
Eos: 3 %
Hematocrit: 51.7 % — ABNORMAL HIGH (ref 37.5–51.0)
Hemoglobin: 17.1 g/dL (ref 13.0–17.7)
Immature Grans (Abs): 0 x10E3/uL (ref 0.0–0.1)
Immature Granulocytes: 0 %
Lymphocytes Absolute: 2.5 x10E3/uL (ref 0.7–3.1)
Lymphs: 24 %
MCH: 29.9 pg (ref 26.6–33.0)
MCHC: 33.1 g/dL (ref 31.5–35.7)
MCV: 90 fL (ref 79–97)
Monocytes Absolute: 0.9 x10E3/uL (ref 0.1–0.9)
Monocytes: 9 %
Neutrophils Absolute: 6.5 x10E3/uL (ref 1.4–7.0)
Neutrophils: 64 %
Platelets: 188 x10E3/uL (ref 150–450)
RBC: 5.72 x10E6/uL (ref 4.14–5.80)
RDW: 13.3 % (ref 11.6–15.4)
WBC: 10.2 x10E3/uL (ref 3.4–10.8)

## 2024-06-27 LAB — CMP14+EGFR
ALT: 46 IU/L — ABNORMAL HIGH (ref 0–44)
AST: 34 IU/L (ref 0–40)
Albumin: 4.4 g/dL (ref 4.1–5.1)
Alkaline Phosphatase: 89 IU/L (ref 44–121)
BUN/Creatinine Ratio: 19 (ref 9–20)
BUN: 17 mg/dL (ref 6–20)
Bilirubin Total: 0.4 mg/dL (ref 0.0–1.2)
CO2: 20 mmol/L (ref 20–29)
Calcium: 9.1 mg/dL (ref 8.7–10.2)
Chloride: 102 mmol/L (ref 96–106)
Creatinine, Ser: 0.88 mg/dL (ref 0.76–1.27)
Globulin, Total: 2.7 g/dL (ref 1.5–4.5)
Glucose: 74 mg/dL (ref 70–99)
Potassium: 4 mmol/L (ref 3.5–5.2)
Sodium: 138 mmol/L (ref 134–144)
Total Protein: 7.1 g/dL (ref 6.0–8.5)
eGFR: 116 mL/min/1.73 (ref >59)

## 2024-06-27 LAB — LIPID PANEL
Chol/HDL Ratio: 3.9 ratio (ref 0.0–5.0)
Cholesterol, Total: 158 mg/dL (ref 100–199)
HDL: 41 mg/dL (ref >39)
LDL Chol Calc (NIH): 99 mg/dL (ref 0–99)
Triglycerides: 96 mg/dL (ref 0–149)
VLDL Cholesterol Cal: 18 mg/dL (ref 5–40)

## 2024-06-27 LAB — TESTOSTERONE: Testosterone: 418 ng/dL (ref 264–916)

## 2024-06-27 LAB — PSA: Prostate Specific Ag, Serum: 0.5 ng/mL (ref 0.0–4.0)

## 2024-06-27 MED ORDER — TESTOSTERONE CYPIONATE 200 MG/ML IM SOLN: 200.0000 mg | INTRAMUSCULAR | 1 refills | Status: AC

## 2024-06-27 NOTE — Progress Notes (Signed)
 Earnest,   Kidney and glucose look great.  ALT improving but still a little elevated.  Testosterone  looks good. Sent testosterone  refills!  Prostate enzyme looks good.  Cholesterol looks good.

## 2024-07-18 NOTE — Procedures (Signed)
 SABRA

## 2024-08-22 ENCOUNTER — Encounter: Payer: Self-pay | Admitting: Physician Assistant

## 2024-08-22 DIAGNOSIS — I10 Essential (primary) hypertension: Secondary | ICD-10-CM

## 2024-08-22 MED ORDER — VALSARTAN-HYDROCHLOROTHIAZIDE 320-25 MG PO TABS: 1.0000 | ORAL_TABLET | Freq: Every day | ORAL | 0 refills | Status: DC

## 2024-08-22 MED ORDER — AMLODIPINE BESYLATE 5 MG PO TABS: 5.0000 mg | ORAL_TABLET | Freq: Every day | ORAL | 0 refills | Status: DC

## 2024-09-18 ENCOUNTER — Other Ambulatory Visit: Payer: Self-pay | Admitting: Physician Assistant

## 2024-09-18 DIAGNOSIS — I10 Essential (primary) hypertension: Secondary | ICD-10-CM

## 2024-11-01 ENCOUNTER — Encounter: Payer: Self-pay | Admitting: Physician Assistant

## 2024-11-01 DIAGNOSIS — I10 Essential (primary) hypertension: Secondary | ICD-10-CM

## 2024-11-03 MED ORDER — VALSARTAN-HYDROCHLOROTHIAZIDE 320-25 MG PO TABS: 1.0000 | ORAL_TABLET | Freq: Every day | ORAL | 3 refills | Status: AC

## 2024-11-03 NOTE — Addendum Note (Signed)
 Addended by: BONNY JON DEL on: 11/03/2024 09:42 AM   Modules accepted: Orders
